# Patient Record
Sex: Male | Born: 1955 | Race: White | Hispanic: No | Smoking: Former smoker
Health system: Southern US, Community
[De-identification: ages and names within clinical notes are randomized; demographics above are authoritative.]

## PROBLEM LIST (undated history)

## (undated) DIAGNOSIS — C679 Malignant neoplasm of bladder, unspecified: Secondary | ICD-10-CM

## (undated) DIAGNOSIS — I10 Essential (primary) hypertension: Secondary | ICD-10-CM

## (undated) DIAGNOSIS — I4891 Unspecified atrial fibrillation: Secondary | ICD-10-CM

## (undated) DIAGNOSIS — I499 Cardiac arrhythmia, unspecified: Secondary | ICD-10-CM

## (undated) DIAGNOSIS — F419 Anxiety disorder, unspecified: Secondary | ICD-10-CM

## (undated) DIAGNOSIS — C61 Malignant neoplasm of prostate: Secondary | ICD-10-CM

## (undated) DIAGNOSIS — M1990 Unspecified osteoarthritis, unspecified site: Secondary | ICD-10-CM

## (undated) DIAGNOSIS — Z973 Presence of spectacles and contact lenses: Secondary | ICD-10-CM

## (undated) DIAGNOSIS — K219 Gastro-esophageal reflux disease without esophagitis: Secondary | ICD-10-CM

## (undated) DIAGNOSIS — I480 Paroxysmal atrial fibrillation: Secondary | ICD-10-CM

## (undated) HISTORY — DX: Unspecified atrial fibrillation: I48.91

## (undated) HISTORY — PX: UPPER GI ENDOSCOPY: SHX6162

## (undated) HISTORY — PX: COLONOSCOPY: SHX174

## (undated) HISTORY — PX: PROSTATE BIOPSY: SHX241

## (undated) HISTORY — PX: TONSILLECTOMY: SUR1361

## (undated) HISTORY — DX: Malignant neoplasm of bladder, unspecified: C67.9

## (undated) HISTORY — DX: Unspecified osteoarthritis, unspecified site: M19.90

## (undated) HISTORY — PX: BLADDER SURGERY: SHX569

---

## 2001-06-02 ENCOUNTER — Emergency Department (HOSPITAL_COMMUNITY): Admission: EM | Admit: 2001-06-02 | Discharge: 2001-06-02 | Payer: Self-pay | Admitting: Emergency Medicine

## 2001-06-04 ENCOUNTER — Other Ambulatory Visit: Admission: RE | Admit: 2001-06-04 | Discharge: 2001-06-04 | Payer: Self-pay | Admitting: General Surgery

## 2005-04-29 HISTORY — PX: PLANTAR FASCIA SURGERY: SHX746

## 2006-03-31 ENCOUNTER — Ambulatory Visit (HOSPITAL_COMMUNITY): Admission: RE | Admit: 2006-03-31 | Discharge: 2006-03-31 | Payer: Self-pay | Admitting: Podiatry

## 2006-03-31 ENCOUNTER — Encounter (INDEPENDENT_AMBULATORY_CARE_PROVIDER_SITE_OTHER): Payer: Self-pay | Admitting: *Deleted

## 2009-02-10 ENCOUNTER — Ambulatory Visit: Payer: Self-pay | Admitting: Gastroenterology

## 2009-02-10 DIAGNOSIS — K219 Gastro-esophageal reflux disease without esophagitis: Secondary | ICD-10-CM | POA: Insufficient documentation

## 2009-02-10 DIAGNOSIS — R1314 Dysphagia, pharyngoesophageal phase: Secondary | ICD-10-CM | POA: Insufficient documentation

## 2009-02-13 ENCOUNTER — Encounter: Payer: Self-pay | Admitting: Gastroenterology

## 2009-02-16 ENCOUNTER — Ambulatory Visit (HOSPITAL_COMMUNITY): Admission: RE | Admit: 2009-02-16 | Discharge: 2009-02-16 | Payer: Self-pay | Admitting: Gastroenterology

## 2009-02-16 ENCOUNTER — Encounter: Payer: Self-pay | Admitting: Gastroenterology

## 2009-02-16 ENCOUNTER — Ambulatory Visit: Payer: Self-pay | Admitting: Gastroenterology

## 2009-02-16 ENCOUNTER — Telehealth: Payer: Self-pay | Admitting: Urgent Care

## 2009-02-17 ENCOUNTER — Encounter: Payer: Self-pay | Admitting: Gastroenterology

## 2009-02-21 ENCOUNTER — Telehealth: Payer: Self-pay | Admitting: Gastroenterology

## 2009-02-24 ENCOUNTER — Ambulatory Visit: Payer: Self-pay | Admitting: Gastroenterology

## 2009-02-24 ENCOUNTER — Ambulatory Visit (HOSPITAL_COMMUNITY): Admission: RE | Admit: 2009-02-24 | Discharge: 2009-02-24 | Payer: Self-pay | Admitting: Gastroenterology

## 2009-03-01 ENCOUNTER — Telehealth (INDEPENDENT_AMBULATORY_CARE_PROVIDER_SITE_OTHER): Payer: Self-pay

## 2009-03-02 ENCOUNTER — Ambulatory Visit (HOSPITAL_COMMUNITY): Admission: RE | Admit: 2009-03-02 | Discharge: 2009-03-02 | Payer: Self-pay | Admitting: Gastroenterology

## 2009-03-02 ENCOUNTER — Encounter: Payer: Self-pay | Admitting: Gastroenterology

## 2009-03-07 ENCOUNTER — Encounter: Payer: Self-pay | Admitting: Gastroenterology

## 2009-04-06 ENCOUNTER — Ambulatory Visit (HOSPITAL_COMMUNITY): Admission: RE | Admit: 2009-04-06 | Discharge: 2009-04-06 | Payer: Self-pay | Admitting: Family Medicine

## 2009-04-07 ENCOUNTER — Ambulatory Visit: Payer: Self-pay | Admitting: Gastroenterology

## 2009-04-07 DIAGNOSIS — Z860100 Personal history of colon polyps, unspecified: Secondary | ICD-10-CM | POA: Insufficient documentation

## 2009-04-07 DIAGNOSIS — A048 Other specified bacterial intestinal infections: Secondary | ICD-10-CM | POA: Insufficient documentation

## 2009-08-17 ENCOUNTER — Encounter: Payer: Self-pay | Admitting: Gastroenterology

## 2009-12-06 ENCOUNTER — Telehealth (INDEPENDENT_AMBULATORY_CARE_PROVIDER_SITE_OTHER): Payer: Self-pay | Admitting: *Deleted

## 2010-03-15 ENCOUNTER — Encounter (INDEPENDENT_AMBULATORY_CARE_PROVIDER_SITE_OTHER): Payer: Self-pay | Admitting: *Deleted

## 2010-04-02 ENCOUNTER — Ambulatory Visit: Payer: Self-pay | Admitting: Gastroenterology

## 2010-05-29 NOTE — Progress Notes (Signed)
  Phone Note Call from Patient   Reason for Call: Refill Medication Summary of Call: Pt needs a Rx fr Omeprazole called in to [facility])  Initial call taken by: Peggyann Shoals,  December 06, 2009 12:03 PM     Appended Document: omeprazole    Prescriptions: OMEPRAZOLE 20 MG CPDR (OMEPRAZOLE) one by mouth 30 min before breakfast daily for acid reflux  #30 x 11   Entered and Authorized by:   Leanna Battles. Dixon Boos   Signed by:   Leanna Battles Lewis PA-C on 12/06/2009   Method used:   Electronically to        [facility] (retail)       [address]       [place], [place]  [address]       Ph: [phone]       Fax: [phone]   RxID:   [id number]

## 2010-05-29 NOTE — Letter (Signed)
Summary: BC[employer] form  BCBS form   Imported By: Hendricks Limes LPN 91/47/8295 62:13:08  _____________________________________________________________________  External Attachment:    Type:   Image     Comment:   External Document

## 2010-05-29 NOTE — Letter (Signed)
Summary: Recall Office Visit  [facility]  [address]   [place], [place] [address]   Phone: [phone]  Fax: [phone]      March 15, 2010   DIEM PAGNOTTA [address] [place], [place]  [address] [birth year]   Dear Mr. Gorgas,   According to our records, it is time for you to schedule a follow-up office visit with [place].   At your convenience, please call [phone] to schedule an office visit. If you have any questions, concerns, or feel that this letter is in error, we would appreciate your call.   Sincerely,    Diana Eves  [facility] Ph: [phone]   Fax: [phone]  Appended Document: fu ov 37yr,gerd/jbb 1 YR F/U OPV IS IN THE COMPUTER

## 2010-05-31 NOTE — Assessment & Plan Note (Signed)
Summary: fu ov 25yr,gerd/jbb   Visit Type:  Follow-up Visit Primary Care Provider:  [facility]  Chief Complaint:  F/U gerd.  History of Present Illness: Mr. Douglas Sims is here for f/u. He has h/o GERD and peptic stricture. He is doing very well on omeprazole. No issues with dysphagia. No abd pain, constipation, diarrhea, melena, brbpr. No n/v. Rarely has breakthrough heartburn. He uses [facility] as needed hand pain. He is awaiting referral to hand specialist for tendon problems. He reports physical exam labwork done recently, unremarkable.  Current Medications (verified): 1)  Alprazolam 1 Mg Tabs (Alprazolam) .... As Needed At Bedtime 2)  Omeprazole 20 Mg Cpdr (Omeprazole) .... One By Mouth 30 Min Before Breakfast Daily For Acid Reflux 3)  [facility] .... As Needed  Allergies (verified): No Known Drug Allergies  Review of Systems      See HPI  Vital Signs:  Patient profile:   55 year old male Height:      72 inches Weight:      200.50 pounds BMI:     27.29 Temp:     97.8 degrees F oral Pulse rate:   72 / minute BP sitting:   110 / 70  (left arm) Cuff size:   regular  Vitals Entered By: Cloria Spring LPN (April 02, 2010 10:45 AM)  Physical Exam  General:  Well developed, well nourished, no acute distress. Head:  Normocephalic and atraumatic. Eyes:  sclera nonicteric Mouth:  op moist Abdomen:  Bowel sounds normal.  Abdomen is soft, nontender, nondistended.  No rebound or guarding.  No hepatosplenomegaly, masses or hernias.  No abdominal bruits.  Extremities:  No clubbing, cyanosis, edema or deformities noted. Neurologic:  Alert and  oriented x4;  grossly normal neurologically. Skin:  Intact without significant lesions or rashes. Psych:  Alert and cooperative. Normal mood and affect.  Impression & Recommendations:  Problem # 1:  GERD (ICD-530.81)  Symptoms well-controlled on omeprazole. No evidence of recurrent peptic stricture. OV one year.   Orders: Est. Patient Level II  (409[address])  Appended Document: fu ov 42yr,gerd/jbb 1 YR F/U OPV IS IN THE COMPUTER

## 2010-09-14 NOTE — Op Note (Signed)
NAME:  Douglas Sims, Douglas Sims                ACCOUNT NO.:  [id number]   MEDICAL RECORD NO.:  [id number]          PATIENT TYPE:  AMB   LOCATION:  DAY                           FACILITY:  [facility]   PHYSICIAN:  Oley Balm. Pricilla Holm, D.P.M.DATE OF BIRTH:  07/10/1955   DATE OF PROCEDURE:  03/31/2006  DATE OF DISCHARGE:                               OPERATIVE REPORT   SURGEON:  Oley Balm. Pricilla Holm, D.P.M.   TYPE OF ANESTHESIA:  Monitored anesthesia care with local infiltrate of  2% Xylocaine and 0.05% Marcaine.   PREOP DIAGNOSIS:  Plantar fibroma, left foot.   POSTOPERATIVE DIAGNOSIS:  Plantar fibroma, left foot.   PROCEDURE:  Radical excision of medial band of plantar fascia with  excision of plantar fibroma.   INDICATIONS FOR SURGERY:  Longstanding history of pain unrelieved by  conservative care.   REFERRAL:  Patrica Duel, M.D.   DESCRIPTION OF PROCEDURE:  The patient brought to the operating room,  and placed on the operating table in the supine position.  The patient's  lower left foot and leg was then prepped and draped in the usual aseptic  manner.  Then with an ankle tourniquet placed and well padded to prevent  contusion; it was elevated 250 mmHg.  After exsanguination of the left  foot, the following surgical procedure was then performed under  monitored anesthesia care with local infiltrate of 2% Xylocaine and 0.5%  Marcaine, approximately 10 mL.   RADICAL EXCISION OF MEDIAL BAND OF THE LEFT PLANTAR FASCIA WITH EXCISION  OF PLANTAR FIBROMA:  Attention was directed to the medial plantar aspect of the left foot,  directly beneath the first metatarsal down to the left heel, where a  lazy S incision was made that measured approximately 6-7 cm in length.  The incision was widened and deepened via sharp and blunt dissection  being sure to identify and retract all vital structures.  The white  glistening mass appeared into the wound which appeared to be  approximately 3 cm in length and 2 cm in  length.  The margins of the  band of the plantar fascia were then identified, and incised at a  plantar fibroma as well as the medial band of the left plantar fascia  was removed in toto. The wound was lavaged with a copious amount of  sterile saline.  The subcutaneous tissues were approximated with a  __________ suture of 4-0 Dexon.  Skin was approximated utilizing  horizontal mattress sutures of 4-0 Prolene.   All surgical sites were then infiltrated with approximately 1/8 mL of  dexamethasone phosphate and mild compressive bandages consisting of  Betadine-soaked Adaptic, sterile 4x4s, and sterile Kling were applied.  The patient tolerated the procedure well; and left the operating room in  apparent good condition with vital signs stable to recovery room.      Oley Balm Pricilla Holm, D.P.M.  Electronically Signed     DBT/MEDQ  D:  03/31/2006  T:  03/31/2006  Job:  [id number]   cc:   Patrica Duel, M.D.  Fax: [phone]

## 2010-09-14 NOTE — H&P (Signed)
NAME:  Douglas Sims, Douglas Sims                ACCOUNT NO.:  [id number]   MEDICAL RECORD NO.:  [id number]          PATIENT TYPE:  AMB   LOCATION:  DAY                           FACILITY:  [facility]   PHYSICIAN:  Oley Balm. Pricilla Holm, D.P.M.DATE OF BIRTH:  [birth year]   DATE OF ADMISSION:  03/31/2006  DATE OF DISCHARGE:  LH                              HISTORY & PHYSICAL   HISTORY OF PRESENT ILLNESS:  Douglas Sims is a 55 year old white male who  presented to the office with a chief complaint of knots on the bottom of  his feet.  The patient relates that the knots have been getting  progressively worse with the knot on the left foot worse than the knot  on the right foot.  The patient relates it is getting progressively  worse to the point where he finds it difficult wearing enclosed shoes.  Treatment has consisted of conservative care with wearing alternative  shoes and injection therapy without relief.  The patient relates that it  has grown to the point of about being an egg and he would like to have  it surgically removed.   PAST HOSPITALIZATIONS:  Hospitalized for accident with his fingers 20  years ago.   MEDICATIONS:  Vicodin, Xanax, Celebrex.   ALLERGIES:  No allergies.   TRANSFUSIONS:  No transfusion or hepatitis.   SOCIAL HISTORY:  He quit smoking.   REVIEW OF SYSTEMS:  Uneventful.   PHYSICAL EXAMINATION:  EXTREMITIES:  Lower extremity exam reveals  palpable pedal pulses, both DP and PT, with spontaneous capillary  filling time.  NEUROLOGIC:  Exam essentially within normal limits.  MUSCULOSKELETAL:  Exam reveals plantar subcutaneous mass on the right  and left foot, with the left foot being worse.  The left foot mass is  approximately 3 x 2 cm in length and width and is very painful to  digital pressure; the right one is about 1 cm in diameter.   RADIOLOGIC FINDINGS:  X-rays are negative for bony pathology.   ASSESSMENT:  Probable plantar fibroma of left foot.   PLAN:  I have reviewed  both conservative and surgical management.  The  patient relates that his problem is getting progressively worse and he  would like to have the problem surgically removed.  I reviewed with him  options which would be conservative therapy which would consist of  continued injection therapy, off-loading, etc.  The patient  relates that he would like to have it surgically removed.  I reviewed  with him the procedure and the complications to the procedure such as  infection, bone infection, postoperative pain, swelling, etc; the  patient seems to understand the same and again, surgery has been  scheduled for March 31, 2006.      Oley Balm Pricilla Holm, D.P.M.  Electronically Signed     DBT/MEDQ  D:  03/30/2006  T:  03/31/2006  Job:  [id number]   cc:   Douglas Sims, M.D.  Fax: [phone]

## 2011-02-01 ENCOUNTER — Encounter: Payer: Self-pay | Admitting: Gastroenterology

## 2011-03-11 ENCOUNTER — Other Ambulatory Visit: Payer: Self-pay | Admitting: Gastroenterology

## 2012-10-12 ENCOUNTER — Emergency Department (HOSPITAL_COMMUNITY)
Admission: EM | Admit: 2012-10-12 | Discharge: 2012-10-12 | Disposition: A | Discharge status: Home / Self Care | Payer: BC Managed Care – PPO | Attending: Emergency Medicine | Admitting: Emergency Medicine

## 2012-10-12 ENCOUNTER — Encounter (HOSPITAL_COMMUNITY): Payer: Self-pay

## 2012-10-12 DIAGNOSIS — M5431 Sciatica, right side: Secondary | ICD-10-CM

## 2012-10-12 DIAGNOSIS — Z79899 Other long term (current) drug therapy: Secondary | ICD-10-CM | POA: Insufficient documentation

## 2012-10-12 DIAGNOSIS — M5430 Sciatica, unspecified side: Secondary | ICD-10-CM | POA: Insufficient documentation

## 2012-10-12 MED ORDER — ONDANSETRON 4 MG PO TBDP: 4.0000 mg | ORAL_TABLET | Freq: Three times a day (TID) | ORAL | Status: DC | PRN

## 2012-10-12 MED ORDER — PREDNISONE 50 MG PO TABS
60.0000 mg | ORAL_TABLET | Freq: Once | ORAL | Status: AC
  Administered 2012-10-12: 60 mg via ORAL
  Filled 2012-10-12: qty 1

## 2012-10-12 MED ORDER — OXYCODONE-ACETAMINOPHEN 5-325 MG PO TABS
2.0000 | ORAL_TABLET | Freq: Once | ORAL | Status: AC
  Administered 2012-10-12: 2 via ORAL
  Filled 2012-10-12: qty 2

## 2012-10-12 MED ORDER — ONDANSETRON 4 MG PO TBDP
4.0000 mg | ORAL_TABLET | Freq: Once | ORAL | Status: AC
  Administered 2012-10-12: 4 mg via ORAL
  Filled 2012-10-12: qty 1

## 2012-10-12 MED ORDER — PREDNISONE 10 MG PO TABS: 20.0000 mg | ORAL_TABLET | Freq: Every day | ORAL | Status: DC

## 2012-10-12 MED ORDER — OXYCODONE-ACETAMINOPHEN 5-325 MG PO TABS: 1.0000 | ORAL_TABLET | ORAL | Status: DC | PRN

## 2012-10-12 NOTE — ED Provider Notes (Signed)
History     CSN: [id number]  Arrival date & time 10/12/12  4098   First MD Initiated Contact with Patient 10/12/12 [phone]      Chief Complaint  Patient presents with  . Leg Pain    (Consider location/radiation/quality/duration/timing/severity/associated sxs/prior treatment) HPI HPI Comments: Douglas Sims is a 57 y.o. male who presents to the Emergency Department complaining of right sided pain from the buttock to the heel. He went to the cruise in yesterday in [place] and took a step and felt a twinge in his back. He developed increased pain tonight walking at work on the concrete floors. Now the pain is sharp from the right buttock to the heel. Denies any known injury, fever, chills, nausea, vomiting.   PCP Dr. Regino Schultze History reviewed. No pertinent past medical history.  History reviewed. No pertinent past surgical history.  No family history on file.  History  Substance Use Topics  . Smoking status: Never Smoker   . Smokeless tobacco: Not on file  . Alcohol Use: No      Review of Systems  Constitutional: Negative for fever.       10 Systems reviewed and are negative for acute change except as noted in the HPI.  HENT: Negative for congestion.   Eyes: Negative for discharge and redness.  Respiratory: Negative for cough and shortness of breath.   Cardiovascular: Negative for chest pain.  Gastrointestinal: Negative for vomiting and abdominal pain.  Musculoskeletal: Negative for back pain.       Right buttock to heel pain  Skin: Negative for rash.  Neurological: Negative for syncope, numbness and headaches.  Psychiatric/Behavioral:       No behavior change.    Allergies  Review of patient's allergies indicates no known allergies.  Home Medications   Current Outpatient Rx  Name  Route  Sig  Dispense  Refill  . ALPRAZolam (XANAX) 0.5 MG tablet   Oral   Take 0.5 mg by mouth at bedtime as needed for sleep.         Marland Kitchen omeprazole (PRILOSEC) 20 MG capsule     TAKE ONE CAPSULE BY MOUTH EVERY DAY 30  MINUTES  BEFORE  BREAKFAST  FOR  ACID  REFLUX   31 capsule   11     BP 114/76  Pulse 62  Temp(Src) 98.6 F (37 C) (Oral)  Resp 20  Ht 6' (1.829 m)  Wt 192 lb (87.091 kg)  BMI 26.03 kg/m2  SpO2 98%  Physical Exam  Nursing note and vitals reviewed. Constitutional: He appears well-developed and well-nourished.  Awake, alert, nontoxic appearance.  HENT:  Head: Normocephalic and atraumatic.  Eyes: EOM are normal. Pupils are equal, round, and reactive to light.  Neck: Normal range of motion. Neck supple.  Cardiovascular: Normal rate and intact distal pulses.   Pulmonary/Chest: Effort normal and breath sounds normal. He exhibits no tenderness.  Abdominal: Soft. Bowel sounds are normal. There is no tenderness. There is no rebound.  Musculoskeletal: He exhibits no tenderness.  Baseline ROM, no obvious new focal weakness.Negative straight leg raise. Pulses 2+ on right side. Mild pain with palpation to right buttock.  Neurological:  Mental status and motor strength appears baseline for patient and situation.  Skin: No rash noted.  Psychiatric: He has a normal mood and affect.    ED Course  Procedures (including critical care time)     MDM  Patient with a right sciatica. Given prednisone, percocet, zofran. Pt stable in ED with no significant  deterioration in condition.The patient appears reasonably screened and/or stabilized for discharge and I doubt any other medical condition or other [facility] requiring further screening, evaluation, or treatment in the ED at this time prior to discharge.  MDM Reviewed: nursing note and vitals           Nicoletta Dress. Colon Branch, MD 10/12/12 [phone]

## 2012-10-12 NOTE — ED Notes (Signed)
Sharp pain starts in right buttock and shoots down right leg.  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

## 2013-05-11 ENCOUNTER — Other Ambulatory Visit: Payer: Self-pay | Admitting: *Deleted

## 2013-05-11 MED ORDER — OMEPRAZOLE 20 MG PO CPDR: DELAYED_RELEASE_CAPSULE | ORAL | Status: DC

## 2013-05-11 NOTE — Telephone Encounter (Signed)
Completed.

## 2013-05-11 NOTE — Telephone Encounter (Signed)
Pt's wife called stating pt needs his medication refilled omeprazole 20mg  pt uses [facility]

## 2013-06-08 ENCOUNTER — Telehealth: Payer: Self-pay

## 2013-06-08 MED ORDER — OMEPRAZOLE 20 MG PO CPDR
DELAYED_RELEASE_CAPSULE | ORAL | Status: DC
Start: 2013-06-08 — End: 2016-07-10

## 2013-06-08 NOTE — Telephone Encounter (Signed)
Completed. Back down to once a day when tolerated.

## 2013-06-08 NOTE — Telephone Encounter (Signed)
Pt had his Omeprazole filled in 04/2013 and it has refills. But he is having problems with reflux and taking it bid some and wanted to know if the prescription could be changed to bid. Please advise!

## 2014-05-23 ENCOUNTER — Other Ambulatory Visit: Payer: Self-pay | Admitting: Orthopedic Surgery

## 2014-06-03 ENCOUNTER — Encounter (HOSPITAL_BASED_OUTPATIENT_CLINIC_OR_DEPARTMENT_OTHER): Payer: Self-pay | Admitting: *Deleted

## 2014-06-03 NOTE — Progress Notes (Signed)
No labs needed

## 2014-06-07 ENCOUNTER — Ambulatory Visit (HOSPITAL_BASED_OUTPATIENT_CLINIC_OR_DEPARTMENT_OTHER)
Admission: RE | Admit: 2014-06-07 | Discharge: 2014-06-07 | Disposition: A | Discharge status: Home / Self Care | Payer: BLUE CROSS/BLUE SHIELD | Source: Ambulatory Visit | Attending: Orthopedic Surgery | Admitting: Orthopedic Surgery

## 2014-06-07 ENCOUNTER — Ambulatory Visit (HOSPITAL_BASED_OUTPATIENT_CLINIC_OR_DEPARTMENT_OTHER): Payer: BLUE CROSS/BLUE SHIELD | Admitting: Anesthesiology

## 2014-06-07 ENCOUNTER — Encounter (HOSPITAL_BASED_OUTPATIENT_CLINIC_OR_DEPARTMENT_OTHER): Payer: Self-pay | Admitting: Orthopedic Surgery

## 2014-06-07 ENCOUNTER — Encounter (HOSPITAL_BASED_OUTPATIENT_CLINIC_OR_DEPARTMENT_OTHER)
Admission: RE | Disposition: A | Discharge status: Home / Self Care | Payer: Self-pay | Source: Ambulatory Visit | Attending: Orthopedic Surgery

## 2014-06-07 DIAGNOSIS — Z79899 Other long term (current) drug therapy: Secondary | ICD-10-CM | POA: Diagnosis not present

## 2014-06-07 DIAGNOSIS — M720 Palmar fascial fibromatosis [Dupuytren]: Secondary | ICD-10-CM | POA: Insufficient documentation

## 2014-06-07 DIAGNOSIS — Z87891 Personal history of nicotine dependence: Secondary | ICD-10-CM | POA: Insufficient documentation

## 2014-06-07 DIAGNOSIS — K219 Gastro-esophageal reflux disease without esophagitis: Secondary | ICD-10-CM | POA: Insufficient documentation

## 2014-06-07 HISTORY — DX: Gastro-esophageal reflux disease without esophagitis: K21.9

## 2014-06-07 HISTORY — DX: Presence of spectacles and contact lenses: Z97.3

## 2014-06-07 HISTORY — PX: FASCIECTOMY: SHX6525

## 2014-06-07 LAB — POCT HEMOGLOBIN-HEMACUE: HEMOGLOBIN: 14.9 g/dL (ref 13.0–17.0)

## 2014-06-07 SURGERY — FASCIECTOMY, PALM
Anesthesia: General | Site: Hand | Laterality: Left

## 2014-06-07 MED ORDER — MIDAZOLAM HCL 5 MG/5ML IJ SOLN
INTRAMUSCULAR | Status: DC | PRN
  Administered 2014-06-07: 2 mg via INTRAVENOUS

## 2014-06-07 MED ORDER — CEFAZOLIN SODIUM-DEXTROSE 2-3 GM-% IV SOLR: 2.0000 g | INTRAVENOUS | Status: DC

## 2014-06-07 MED ORDER — BUPIVACAINE HCL (PF) 0.25 % IJ SOLN
INTRAMUSCULAR | Status: AC
  Filled 2014-06-07: qty 30

## 2014-06-07 MED ORDER — THROMBIN 5000 UNITS EX SOLR
CUTANEOUS | Status: DC | PRN
  Administered 2014-06-07: 5000 [IU] via TOPICAL

## 2014-06-07 MED ORDER — HYDROMORPHONE HCL 1 MG/ML IJ SOLN: 0.2500 mg | INTRAMUSCULAR | Status: DC | PRN

## 2014-06-07 MED ORDER — THROMBIN 5000 UNITS EX SOLR
CUTANEOUS | Status: AC
  Filled 2014-06-07: qty 5000

## 2014-06-07 MED ORDER — MIDAZOLAM HCL 2 MG/2ML IJ SOLN: 1.0000 mg | INTRAMUSCULAR | Status: DC | PRN

## 2014-06-07 MED ORDER — CEFAZOLIN SODIUM-DEXTROSE 2-3 GM-% IV SOLR
2.0000 g | INTRAVENOUS | Status: AC
  Administered 2014-06-07: 2 g via INTRAVENOUS

## 2014-06-07 MED ORDER — CHLORHEXIDINE GLUCONATE 4 % EX LIQD: 60.0000 mL | Freq: Once | CUTANEOUS | Status: DC

## 2014-06-07 MED ORDER — DEXAMETHASONE SODIUM PHOSPHATE 4 MG/ML IJ SOLN
INTRAMUSCULAR | Status: DC | PRN
  Administered 2014-06-07: 10 mg via INTRAVENOUS

## 2014-06-07 MED ORDER — CEFAZOLIN SODIUM-DEXTROSE 2-3 GM-% IV SOLR
INTRAVENOUS | Status: AC
  Filled 2014-06-07: qty 50

## 2014-06-07 MED ORDER — OXYCODONE HCL 5 MG/5ML PO SOLN: 5.0000 mg | Freq: Once | ORAL | Status: AC | PRN

## 2014-06-07 MED ORDER — FENTANYL CITRATE 0.05 MG/ML IJ SOLN: 50.0000 ug | INTRAMUSCULAR | Status: DC | PRN

## 2014-06-07 MED ORDER — OXYCODONE HCL 5 MG PO TABS
5.0000 mg | ORAL_TABLET | Freq: Once | ORAL | Status: AC | PRN
  Administered 2014-06-07: 5 mg via ORAL

## 2014-06-07 MED ORDER — LIDOCAINE HCL (CARDIAC) 20 MG/ML IV SOLN
INTRAVENOUS | Status: DC | PRN
  Administered 2014-06-07: 100 mg via INTRAVENOUS

## 2014-06-07 MED ORDER — ONDANSETRON HCL 4 MG/2ML IJ SOLN: 4.0000 mg | Freq: Once | INTRAMUSCULAR | Status: DC | PRN

## 2014-06-07 MED ORDER — FENTANYL CITRATE 0.05 MG/ML IJ SOLN
INTRAMUSCULAR | Status: AC
  Filled 2014-06-07: qty 4

## 2014-06-07 MED ORDER — ONDANSETRON HCL 4 MG/2ML IJ SOLN
INTRAMUSCULAR | Status: DC | PRN
  Administered 2014-06-07: 4 mg via INTRAVENOUS

## 2014-06-07 MED ORDER — PROPOFOL 10 MG/ML IV EMUL
INTRAVENOUS | Status: AC
  Filled 2014-06-07: qty 50

## 2014-06-07 MED ORDER — LACTATED RINGERS IV SOLN
INTRAVENOUS | Status: DC
  Administered 2014-06-07 (×2): via INTRAVENOUS

## 2014-06-07 MED ORDER — MIDAZOLAM HCL 2 MG/2ML IJ SOLN
INTRAMUSCULAR | Status: AC
  Filled 2014-06-07: qty 2

## 2014-06-07 MED ORDER — HYDROCODONE-ACETAMINOPHEN 5-325 MG PO TABS: 1.0000 | ORAL_TABLET | Freq: Four times a day (QID) | ORAL | Status: DC | PRN

## 2014-06-07 MED ORDER — FENTANYL CITRATE 0.05 MG/ML IJ SOLN
INTRAMUSCULAR | Status: DC | PRN
  Administered 2014-06-07: 100 ug via INTRAVENOUS
  Administered 2014-06-07: 25 ug via INTRAVENOUS

## 2014-06-07 MED ORDER — PROPOFOL 10 MG/ML IV BOLUS
INTRAVENOUS | Status: DC | PRN
  Administered 2014-06-07: 200 mg via INTRAVENOUS

## 2014-06-07 MED ORDER — OXYCODONE HCL 5 MG PO TABS
ORAL_TABLET | ORAL | Status: AC
  Filled 2014-06-07: qty 1

## 2014-06-07 MED ORDER — BUPIVACAINE HCL (PF) 0.25 % IJ SOLN
INTRAMUSCULAR | Status: DC | PRN
  Administered 2014-06-07: 8 mL

## 2014-06-07 SURGICAL SUPPLY — 53 items
BLADE MINI RND TIP GREEN BEAV (BLADE) ×1 IMPLANT
BLADE SURG 15 STRL LF DISP TIS (BLADE) ×1 IMPLANT
BLADE SURG 15 STRL SS (BLADE) ×2
BNDG CMPR 9X4 STRL LF SNTH (GAUZE/BANDAGES/DRESSINGS) ×1
BNDG COHESIVE 3X5 TAN STRL LF (GAUZE/BANDAGES/DRESSINGS) ×2 IMPLANT
BNDG ESMARK 4X9 LF (GAUZE/BANDAGES/DRESSINGS) ×2 IMPLANT
BNDG GAUZE ELAST 4 BULKY (GAUZE/BANDAGES/DRESSINGS) ×2 IMPLANT
CHLORAPREP W/TINT 26ML (MISCELLANEOUS) ×2 IMPLANT
CORDS BIPOLAR (ELECTRODE) ×2 IMPLANT
COVER BACK TABLE 60X90IN (DRAPES) ×2 IMPLANT
COVER MAYO STAND STRL (DRAPES) ×2 IMPLANT
CUFF TOURNIQUET SINGLE 18IN (TOURNIQUET CUFF) ×1 IMPLANT
DECANTER SPIKE VIAL GLASS SM (MISCELLANEOUS) IMPLANT
DRAPE EXTREMITY T 121X128X90 (DRAPE) ×2 IMPLANT
DRAPE SURG 17X23 STRL (DRAPES) ×2 IMPLANT
DRSG KUZMA FLUFF (GAUZE/BANDAGES/DRESSINGS) ×2 IMPLANT
GAUZE SPONGE 4X4 12PLY STRL (GAUZE/BANDAGES/DRESSINGS) ×2 IMPLANT
GAUZE XEROFORM 1X8 LF (GAUZE/BANDAGES/DRESSINGS) ×2 IMPLANT
GLOVE BIO SURGEON STRL SZ7.5 (GLOVE) ×1 IMPLANT
GLOVE BIOGEL PI IND STRL 7.0 (GLOVE) IMPLANT
GLOVE BIOGEL PI IND STRL 7.5 (GLOVE) IMPLANT
GLOVE BIOGEL PI IND STRL 8 (GLOVE) IMPLANT
GLOVE BIOGEL PI IND STRL 8.5 (GLOVE) ×1 IMPLANT
GLOVE BIOGEL PI INDICATOR 7.0 (GLOVE) ×1
GLOVE BIOGEL PI INDICATOR 7.5 (GLOVE) ×1
GLOVE BIOGEL PI INDICATOR 8 (GLOVE) ×1
GLOVE BIOGEL PI INDICATOR 8.5 (GLOVE) ×1
GLOVE ECLIPSE 6.5 STRL STRAW (GLOVE) ×1 IMPLANT
GLOVE EXAM NITRILE LRG STRL (GLOVE) ×1 IMPLANT
GLOVE SURG ORTHO 8.0 STRL STRW (GLOVE) ×2 IMPLANT
GLOVE SURG SS PI 7.0 STRL IVOR (GLOVE) ×1 IMPLANT
GOWN STRL REUS W/ TWL LRG LVL3 (GOWN DISPOSABLE) ×1 IMPLANT
GOWN STRL REUS W/TWL LRG LVL3 (GOWN DISPOSABLE) ×4
GOWN STRL REUS W/TWL XL LVL3 (GOWN DISPOSABLE) ×3 IMPLANT
LOOP VESSEL MAXI BLUE (MISCELLANEOUS) ×2 IMPLANT
NS IRRIG 1000ML POUR BTL (IV SOLUTION) ×2 IMPLANT
PACK BASIN DAY SURGERY FS (CUSTOM PROCEDURE TRAY) ×2 IMPLANT
PAD CAST 3X4 CTTN HI CHSV (CAST SUPPLIES) ×1 IMPLANT
PADDING CAST ABS 3INX4YD NS (CAST SUPPLIES)
PADDING CAST ABS 4INX4YD NS (CAST SUPPLIES) ×1
PADDING CAST ABS COTTON 3X4 (CAST SUPPLIES) IMPLANT
PADDING CAST ABS COTTON 4X4 ST (CAST SUPPLIES) ×1 IMPLANT
PADDING CAST COTTON 3X4 STRL (CAST SUPPLIES) ×2
SLEEVE SCD COMPRESS KNEE MED (MISCELLANEOUS) ×2 IMPLANT
SPLINT PLASTER CAST XFAST 3X15 (CAST SUPPLIES) IMPLANT
SPLINT PLASTER XTRA FASTSET 3X (CAST SUPPLIES) ×10
STOCKINETTE 4X48 STRL (DRAPES) ×2 IMPLANT
SUT ETHILON 5 0 PS 2 18 (SUTURE) ×1 IMPLANT
SUT SILK 2 0 FS (SUTURE) ×1 IMPLANT
SYR BULB 3OZ (MISCELLANEOUS) ×2 IMPLANT
SYR CONTROL 10ML LL (SYRINGE) ×2 IMPLANT
TOWEL OR 17X24 6PK STRL BLUE (TOWEL DISPOSABLE) ×4 IMPLANT
UNDERPAD 30X30 INCONTINENT (UNDERPADS AND DIAPERS) ×1 IMPLANT

## 2014-06-07 NOTE — Anesthesia Preprocedure Evaluation (Signed)
Anesthesia Evaluation  Patient identified by MRN, date of birth, ID band Patient awake    Reviewed: Allergy & Precautions, NPO status , Patient's Chart, lab work & pertinent test results  Airway Mallampati: I  TM Distance: >3 FB Neck ROM: Full    Dental  (+) Teeth Intact, Dental Advisory Given   Pulmonary former smoker,  breath sounds clear to auscultation        Cardiovascular Rhythm:Regular Rate:Normal     Neuro/Psych    GI/Hepatic GERD-  Medicated and Controlled,  Endo/Other    Renal/GU      Musculoskeletal   Abdominal   Peds  Hematology   Anesthesia Other Findings   Reproductive/Obstetrics                            Anesthesia Physical Anesthesia Plan  ASA: I  Anesthesia Plan: General   Post-op Pain Management:    Induction: Intravenous  Airway Management Planned: LMA  Additional Equipment:   Intra-op Plan:   Post-operative Plan: Extubation in OR  Informed Consent: I have reviewed the patients History and Physical, chart, labs and discussed the procedure including the risks, benefits and alternatives for the proposed anesthesia with the patient or authorized representative who has indicated his/her understanding and acceptance.   Dental advisory given  Plan Discussed with: CRNA, Anesthesiologist and Surgeon  Anesthesia Plan Comments:         Anesthesia Quick Evaluation  

## 2014-06-07 NOTE — Anesthesia Procedure Notes (Signed)
Procedure Name: Intubation Date/Time: 06/07/2014 12:12 PM Performed by: Lyndee Leo Pre-anesthesia Checklist: Patient identified, Emergency Drugs available, Suction available and Patient being monitored Patient Re-evaluated:Patient Re-evaluated prior to inductionOxygen Delivery Method: Circle System Utilized Preoxygenation: Pre-oxygenation with 100% oxygen Intubation Type: IV induction Ventilation: Mask ventilation without difficulty LMA: LMA inserted LMA Size: 4.0 Number of attempts: 1 Airway Equipment and Method: Oral airway Tube secured with: Tape Dental Injury: Teeth and Oropharynx as per pre-operative assessment

## 2014-06-07 NOTE — Discharge Instructions (Addendum)

## 2014-06-07 NOTE — Op Note (Signed)
Dictation Number [id number]

## 2014-06-07 NOTE — H&P (Signed)
Douglas Sims is a 59 year-old right-hand dominant male referred by Dr. Patrica Duel for consultation with respect to Dupuytren's contracture of his left ring finger. This has been present for approximately 1-2 years, gradually increasing.  He is right-hand dominant.  He is a Dealer.  He has no history of diabetes, thyroid problems, arthritis or gout. He is of native [place] descent.  He did have these on his feet, he did not have on his penis.  He has a prior history of an injury to his index and middle finger left hand in a carding machine treated by Dr. Alphonzo Cruise when he first started practice.  He complains of a feeling of stabbing pain to the left hand when he grips something due to the mass in the palm. He has had Dupuytren's excised by Dr. Alphonzo Cruise on his right side, ring finger.   ALLERGIES:     None. MEDICATIONS:    Omeprazole, Xanax, meloxicam.  SURGICAL HISTORY:    Both feet and right hand operated on for the Dupuytren's and Ledderhose.   FAMILY MEDICAL HISTORY:  Negative. SOCIAL HISTORY:     He does not smoke or drink.  He is married and works as a Tax adviser at [employer]. REVIEW OF SYSTEMS:    Positive for glasses, otherwise negative 14 points.   Douglas Sims is an 59 y.o. male.   Chief Complaint: Dupuytren's contracture left ring finger HPI: see above  Past Medical History  Diagnosis Date  . Wears glasses   . GERD (gastroesophageal reflux disease)     Past Surgical History  Procedure Laterality Date  . Colonoscopy    . Upper gi endoscopy    . Plantar fascia surgery  2007    left foot  . Tonsillectomy      History reviewed. No pertinent family history. Social History:  reports that he quit smoking about 21 years ago. He does not have any smokeless tobacco history on file. He reports that he does not drink alcohol or use illicit drugs.  Allergies: No Known Allergies  No prescriptions prior to admission    No results found for this or any previous visit (from  the past 48 hour(s)).  No results found.   Pertinent items are noted in HPI.  Height 6' (1.829 m), weight 86.183 kg (190 lb).  General appearance: alert, cooperative and appears stated age Head: Normocephalic, without obvious abnormality Neck: no JVD Resp: clear to auscultation bilaterally Cardio: regular rate and rhythm, S1, S2 normal, no murmur, click, rub or gallop GI: soft, non-tender; bowel sounds normal; no masses,  no organomegaly Extremities: flexion contracture left ring finger Pulses: 2+ and symmetric Skin: Skin color, texture, turgor normal. No rashes or lesions or normal and vascularity normal Neurologic: Grossly normal Incision/Wound: na  Assessment/Plan X-rays reveal degenerative change at the carpometacarpal joint of his thumb, pan trapezial in nature. This is asymptomatic.    DIAGNOSIS:   Dupuytren's contracture left ring finger.    RECOMMENDATIONS/PLAN:    He would like to have this operated on.  We have discussed various treatment alternatives with him along with the etiology.  We have discussed the possibility of aponeurotomies, flexor injection, fasciotomy, fasciectomy, the risks and complications of each are discussed.  He is advised there is potential for infection with any treatment, the possibility of dystrophy, possibility of injury to arteries, nerves, tendons, possibility of loss of finger, possibility of flexion deformities recurring, the recurrence rate of each are discussed at length with him.  Approximately 35 minutes was spent with the patient .  He would like to proceed and would like to be scheduled for fasciectomy ring finger, left hand.  This is being scheduled as an outpatient under regional anesthesia.  Secret Kristensen R 06/07/2014, 9:21 AM

## 2014-06-07 NOTE — Anesthesia Postprocedure Evaluation (Signed)
  Anesthesia Post-op Note  Patient: Douglas Sims  Procedure(s) Performed: Procedure(s): LEFT PALM FASCIECTOMY LEFT RING FINGER (Left)  Patient Location: PACU  Anesthesia Type: General   Level of Consciousness: awake, alert  and oriented  Airway and Oxygen Therapy: Patient Spontanous Breathing  Post-op Pain: none  Post-op Assessment: Post-op Vital signs reviewed  Post-op Vital Signs: Reviewed  Last Vitals:  Filed Vitals:   06/07/14 1345  BP: 137/89  Pulse: 60  Temp: 36.4 C  Resp: 18    Complications: No apparent anesthesia complications

## 2014-06-07 NOTE — Brief Op Note (Signed)
06/07/2014  1:01 PM  PATIENT:  Douglas Sims  59 y.o. male  PRE-OPERATIVE DIAGNOSIS:  DUPUYTREN'S CONTRACTURE LEFT RING FINGER  POST-OPERATIVE DIAGNOSIS:  DUPUYTREN'S CONTRACTURE LEFT RING FINGER  PROCEDURE:  Procedure(s): LEFT PALM FASCIECTOMY LEFT RING FINGER (Left)  SURGEON:  Surgeon(s) and Role:    * Daryll Brod, MD - Primary    * Leanora Cover, MD - Assisting  PHYSICIAN ASSISTANT:   ASSISTANTS: K Zyeir Dymek,MD   ANESTHESIA:   local and general  EBL:  Total I/O In: 1000 [I.V.:1000] Out: -   BLOOD ADMINISTERED:none  DRAINS: vessel loop   LOCAL MEDICATIONS USED:  BUPIVICAINE   SPECIMEN:  Excision  DISPOSITION OF SPECIMEN:  PATHOLOGY  COUNTS:  YES  TOURNIQUET:   Total Tourniquet Time Documented: Upper Arm (Left) - 32 minutes Total: Upper Arm (Left) - 32 minutes   DICTATION: .Other Dictation: Dictation Number [id number]  PLAN OF CARE: Discharge to home after PACU  PATIENT DISPOSITION:  PACU - hemodynamically stable.

## 2014-06-07 NOTE — Transfer of Care (Signed)
Immediate Anesthesia Transfer of Care Note  Patient: Douglas Sims  Procedure(s) Performed: Procedure(s): LEFT PALM FASCIECTOMY LEFT RING FINGER (Left)  Patient Location: PACU  Anesthesia Type:General  Level of Consciousness: sedated and unresponsive  Airway & Oxygen Therapy: Patient Spontanous Breathing and Patient connected to face mask oxygen  Post-op Assessment: Report given to RN and Post -op Vital signs reviewed and stable  Post vital signs: Reviewed and stable  Last Vitals:  Filed Vitals:   06/07/14 1007  BP: 140/77  Pulse: 55  Temp: 36.5 C  Resp: 20    Complications: No apparent anesthesia complications

## 2014-06-08 ENCOUNTER — Encounter (HOSPITAL_BASED_OUTPATIENT_CLINIC_OR_DEPARTMENT_OTHER): Payer: Self-pay | Admitting: Orthopedic Surgery

## 2014-06-08 NOTE — Op Note (Signed)
NAME:  Douglas Sims, Douglas Sims NO.:  [id number]  MEDICAL RECORD NO.:  [id number]  LOCATION:                                 FACILITY:  PHYSICIAN:  Daryll Brod, M.D.            DATE OF BIRTH:  DATE OF PROCEDURE:  06/07/2014 DATE OF DISCHARGE:                              OPERATIVE REPORT   PREOPERATIVE DIAGNOSIS:  Dupuytren contracture, left ring finger.  POSTOPERATIVE DIAGNOSIS:  Dupuytren contracture, left ring finger.  OPERATION:  Fasciectomy of palmar fascia, left ring finger.  SURGEON:  Daryll Brod, MD  ASSISTANT:  Leanora Cover, MD  ANESTHESIA:  General with local infiltration.  ANESTHESIOLOGIST:  Crews.  HISTORY:  The patient is a 59 year old male with a history of Dupuytren contracture.  He has undergone excision on his right side, has developed a contracture to the left ring finger.  He is desirous proceeding to have this excised.  He is aware of other treatment alternatives including epineurotomy, Xiaflex injection, fasciotomy, elected to undergo fasciotomy.  Pre, peri, and postoperative course have been discussed along with risks and complications.  He is aware that there is no guarantee with the surgery, possibility of infection; recurrence of injury to arteries, nerves, tendons; incomplete relief of symptoms, dystrophy, possibility of open areas, loss of skin.  In the preoperative area, the patient is seen, the extremity marked by both patient and surgeon.  Antibiotic given.  PROCEDURE IN DETAIL:  The patient was brought to the operating room, where general anesthetic was carried out without difficulty.  He was prepped using ChloraPrep, supine position with the left arm free.  A 3- minute dry time was allowed.  Time-out taken, confirming the patient and procedure.  A volar Bruner incision was made, carried down through subcutaneous tissue.  This was directly over left ring finger cord. Neurovascular structures were identified proximally.  The fascia  was then followed distally.  This was a central cord.  This was released up to the A2 pulley.  The dissection carried down onto the proximal phalanx to middle phalanx.  Lateral digital sheet was involved.  Neurovascular bundles were dissected free with moderate difficulty.  The cord entered into the skin and several places requiring opening of the skin.  Small skin tear was occurred at the distal metacarpophalangeal joint crease. The entire cord was removed, sent to Pathology.  The finger came entirely straight.  The neurovascular bundles were intact over the entire course.  The wound was copiously irrigated with saline.  This was then sprayed with thrombin spray.  A doubled over vessel loop drain was then placed to the depths of the wound.  The incisions were then closed with interrupted 5-0 nylon sutures.  The local infiltration with 0.25% bupivacaine without epinephrine, approximately 9 mL was given.  This area was done over the entire palm.  A sterile compressive dressing was applied.  The tourniquet was deflated.  The fingers immediately pinked. A splint was applied dorsally. The dressing completed and the patient taken to the recovery room for observation in satisfactory condition.  Again, specimen was sent to Pathology.  The patient will be discharged home to return  to the [facility] in 1 week on Norco.          ______________________________ Daryll Brod, M.D.     GK/MEDQ  D:  06/07/2014  T:  06/08/2014  Job:  [id number]

## 2015-06-13 DIAGNOSIS — N486 Induration penis plastica: Secondary | ICD-10-CM | POA: Insufficient documentation

## 2015-09-29 DIAGNOSIS — R3912 Poor urinary stream: Secondary | ICD-10-CM | POA: Diagnosis not present

## 2015-09-29 DIAGNOSIS — D494 Neoplasm of unspecified behavior of bladder: Secondary | ICD-10-CM | POA: Diagnosis not present

## 2015-09-29 DIAGNOSIS — N486 Induration penis plastica: Secondary | ICD-10-CM | POA: Diagnosis not present

## 2015-10-02 DIAGNOSIS — Z7982 Long term (current) use of aspirin: Secondary | ICD-10-CM | POA: Diagnosis not present

## 2015-10-02 DIAGNOSIS — N329 Bladder disorder, unspecified: Secondary | ICD-10-CM | POA: Diagnosis not present

## 2015-10-02 DIAGNOSIS — D090 Carcinoma in situ of bladder: Secondary | ICD-10-CM | POA: Diagnosis not present

## 2015-10-02 DIAGNOSIS — Z87891 Personal history of nicotine dependence: Secondary | ICD-10-CM | POA: Diagnosis not present

## 2015-10-02 DIAGNOSIS — R3912 Poor urinary stream: Secondary | ICD-10-CM | POA: Diagnosis not present

## 2015-10-02 DIAGNOSIS — C674 Malignant neoplasm of posterior wall of bladder: Secondary | ICD-10-CM | POA: Diagnosis not present

## 2015-10-02 DIAGNOSIS — N4289 Other specified disorders of prostate: Secondary | ICD-10-CM | POA: Diagnosis not present

## 2015-10-02 DIAGNOSIS — Z79899 Other long term (current) drug therapy: Secondary | ICD-10-CM | POA: Diagnosis not present

## 2015-10-02 DIAGNOSIS — N486 Induration penis plastica: Secondary | ICD-10-CM | POA: Diagnosis not present

## 2015-10-02 DIAGNOSIS — D494 Neoplasm of unspecified behavior of bladder: Secondary | ICD-10-CM | POA: Diagnosis not present

## 2015-10-02 DIAGNOSIS — K219 Gastro-esophageal reflux disease without esophagitis: Secondary | ICD-10-CM | POA: Diagnosis not present

## 2015-10-02 DIAGNOSIS — C679 Malignant neoplasm of bladder, unspecified: Secondary | ICD-10-CM | POA: Diagnosis not present

## 2015-10-02 DIAGNOSIS — F419 Anxiety disorder, unspecified: Secondary | ICD-10-CM | POA: Diagnosis not present

## 2015-10-13 DIAGNOSIS — D090 Carcinoma in situ of bladder: Secondary | ICD-10-CM | POA: Diagnosis not present

## 2015-10-20 DIAGNOSIS — D090 Carcinoma in situ of bladder: Secondary | ICD-10-CM | POA: Diagnosis not present

## 2015-10-30 DIAGNOSIS — D090 Carcinoma in situ of bladder: Secondary | ICD-10-CM | POA: Diagnosis not present

## 2015-11-06 DIAGNOSIS — C679 Malignant neoplasm of bladder, unspecified: Secondary | ICD-10-CM | POA: Diagnosis not present

## 2015-11-06 DIAGNOSIS — D090 Carcinoma in situ of bladder: Secondary | ICD-10-CM | POA: Diagnosis not present

## 2015-11-17 DIAGNOSIS — D090 Carcinoma in situ of bladder: Secondary | ICD-10-CM | POA: Diagnosis not present

## 2015-11-17 DIAGNOSIS — C679 Malignant neoplasm of bladder, unspecified: Secondary | ICD-10-CM | POA: Diagnosis not present

## 2015-11-24 DIAGNOSIS — D090 Carcinoma in situ of bladder: Secondary | ICD-10-CM | POA: Diagnosis not present

## 2015-11-24 DIAGNOSIS — C679 Malignant neoplasm of bladder, unspecified: Secondary | ICD-10-CM | POA: Diagnosis not present

## 2015-12-01 DIAGNOSIS — D090 Carcinoma in situ of bladder: Secondary | ICD-10-CM | POA: Diagnosis not present

## 2015-12-29 DIAGNOSIS — C672 Malignant neoplasm of lateral wall of bladder: Secondary | ICD-10-CM | POA: Diagnosis not present

## 2015-12-29 DIAGNOSIS — R1900 Intra-abdominal and pelvic swelling, mass and lump, unspecified site: Secondary | ICD-10-CM | POA: Diagnosis not present

## 2015-12-29 DIAGNOSIS — R300 Dysuria: Secondary | ICD-10-CM | POA: Diagnosis not present

## 2016-01-02 DIAGNOSIS — R911 Solitary pulmonary nodule: Secondary | ICD-10-CM | POA: Diagnosis not present

## 2016-01-02 DIAGNOSIS — K828 Other specified diseases of gallbladder: Secondary | ICD-10-CM | POA: Diagnosis not present

## 2016-01-02 DIAGNOSIS — K769 Liver disease, unspecified: Secondary | ICD-10-CM | POA: Diagnosis not present

## 2016-01-02 DIAGNOSIS — R1900 Intra-abdominal and pelvic swelling, mass and lump, unspecified site: Secondary | ICD-10-CM | POA: Diagnosis not present

## 2016-01-02 DIAGNOSIS — C672 Malignant neoplasm of lateral wall of bladder: Secondary | ICD-10-CM | POA: Diagnosis not present

## 2016-01-03 ENCOUNTER — Ambulatory Visit (INDEPENDENT_AMBULATORY_CARE_PROVIDER_SITE_OTHER): Payer: BLUE CROSS/BLUE SHIELD | Admitting: Gastroenterology

## 2016-01-03 ENCOUNTER — Encounter: Payer: Self-pay | Admitting: Gastroenterology

## 2016-01-03 ENCOUNTER — Other Ambulatory Visit: Payer: Self-pay

## 2016-01-03 DIAGNOSIS — K824 Cholesterolosis of gallbladder: Secondary | ICD-10-CM

## 2016-01-03 DIAGNOSIS — R1314 Dysphagia, pharyngoesophageal phase: Secondary | ICD-10-CM | POA: Diagnosis not present

## 2016-01-03 DIAGNOSIS — B9681 Helicobacter pylori [H. pylori] as the cause of diseases classified elsewhere: Secondary | ICD-10-CM

## 2016-01-03 DIAGNOSIS — K769 Liver disease, unspecified: Secondary | ICD-10-CM

## 2016-01-03 DIAGNOSIS — Z86010 Personal history of colonic polyps: Secondary | ICD-10-CM

## 2016-01-03 DIAGNOSIS — K219 Gastro-esophageal reflux disease without esophagitis: Secondary | ICD-10-CM

## 2016-01-03 DIAGNOSIS — A048 Other specified bacterial intestinal infections: Secondary | ICD-10-CM

## 2016-01-03 DIAGNOSIS — K7689 Other specified diseases of liver: Secondary | ICD-10-CM

## 2016-01-03 DIAGNOSIS — Z1211 Encounter for screening for malignant neoplasm of colon: Secondary | ICD-10-CM

## 2016-01-03 MED ORDER — PEG-KCL-NACL-NASULF-NA ASC-C 100 G PO SOLR: 1.0000 | ORAL | 0 refills | Status: DC

## 2016-01-03 NOTE — Progress Notes (Signed)
ON RECALL  °

## 2016-01-03 NOTE — Assessment & Plan Note (Addendum)
DISCOVERED INCIDENTALLY ON CT SEP 5.  CMP AND MRI OF LIVER AND GALLBLADDER. CHECK AFP AND HEP C Ab.

## 2016-01-03 NOTE — Assessment & Plan Note (Signed)
SYMPTOMS CONTROLLED/RESOLVED. 

## 2016-01-03 NOTE — Progress Notes (Addendum)
   Subjective:    Patient ID: Douglas Sims, male    DOB: [birth year], 60 y.o.   MRN: [id number]  Glo Herring., MD  HPI CONSTIPATION BEEN A PROBLEM FOR 2 MOS. NEEDS MOM-PRUNE JUICE LAST WEEK. USED EX-LAX AS A KID. BOWEL CHANGED USUALLY CAN EAT AND GO TO BATHROOM AFTER COFFEE. BELLY HURTS ALL THE TIME.   PT DENIES FEVER, CHILLS, HEMATOCHEZIA, HEMATEMESIS, nausea, vomiting, melena, diarrhea, CHEST PAIN, SHORTNESS OF BREATH, CHANGE IN BOWEL IN HABITS, problems swallowing, problems with sedation, OR heartburn or indigestion.   Past Medical History:  Diagnosis Date  . GERD (gastroesophageal reflux disease)   . Wears glasses     Past Surgical History:  Procedure Laterality Date  . COLONOSCOPY    . FASCIECTOMY Left 06/07/2014   Procedure: LEFT PALM FASCIECTOMY LEFT RING FINGER;  Surgeon: Daryll Brod, MD;  Location: [facility];  Service: Orthopedics;  Laterality: Left;  . PLANTAR FASCIA SURGERY  2007   left foot  . TONSILLECTOMY    . UPPER GI ENDOSCOPY     No Known Allergies   Current Outpatient Prescriptions  Medication Sig Dispense Refill  . ALPRAZolam (XANAX) 0.5 MG tablet Take 0.5 mg by mouth at bedtime as needed for sleep.    Marland Kitchen aspirin EC 81 MG tablet Take 81 mg by mouth.    . meloxicam (MOBIC) 7.5 MG tablet Take 7.5 mg by mouth.    Marland Kitchen omeprazole (PRILOSEC) 20 MG capsule TAKE ONE CAPSULE BY MOUTH TWICE A  DAY 30  MINUTES  BEFORE  BREAKFAST  And DINNER FOR  ACID  REFLUX    . Saw Palmetto, Serenoa repens, 160 MG TABS Take by mouth.    . tamsulosin (FLOMAX) 0.4 MG CAPS capsule Take 0.4 mg by mouth.    Marland Kitchen HYDROcodone-acetaminophen (NORCO) 5-325 MG per tablet Take 1 tablet by mouth every 6 (six) hours as needed for moderate pain. (Patient not taking: Reported on 01/03/2016)      FAMILY HISTORY: DAD-LUNG CANCER, NO COLON CANCER OR POLYPS.  Social History   Social History  . Marital status: Married    Spouse name: N/A  . Number of children: N/A  . Years of  education: N/A   Social History Main Topics  . Smoking status: Former Smoker    Quit date: 06/03/1993  . Smokeless tobacco: None  . Alcohol use No  . Drug use: No  . Sexual activity: Not Asked   Review of Systems PER HPI OTHERWISE ALL SYSTEMS ARE NEGATIVE.    Objective:   Physical Exam  Constitutional: He is oriented to person, place, and time. He appears well-developed and well-nourished. No distress.  HENT:  Head: Normocephalic and atraumatic.  Mouth/Throat: Oropharynx is clear and moist. No oropharyngeal exudate.  Eyes: Pupils are equal, round, and reactive to light. No scleral icterus.  Neck: Normal range of motion. Neck supple.  Cardiovascular: Normal rate, regular rhythm and normal heart sounds.   Pulmonary/Chest: Effort normal and breath sounds normal. No respiratory distress.  Abdominal: Soft. Bowel sounds are normal. He exhibits no distension. There is no tenderness.  Musculoskeletal: He exhibits no edema.  Lymphadenopathy:    He has no cervical adenopathy.  Neurological: He is alert and oriented to person, place, and time.  NO  NEW FOCAL DEFICITS  Psychiatric:  SLIGHTLY ANXIOUS MOOD, NL AFFECT  Vitals reviewed.      Assessment & Plan:

## 2016-01-03 NOTE — Assessment & Plan Note (Signed)
Personal history of polyps. Last tcs > 5 yrs.  TCS MON SEP 18. DISCUSSED PROCEDURE, BENEFITS, & RISKS: < 1% chance of medication reaction, bleeding, perforation, or rupture of spleen/liver. PHENERGAN 12.5 MG IV IN PREOP SUPREP

## 2016-01-03 NOTE — Addendum Note (Signed)
Addended by: Danie Binder on: 01/03/2016 02:55 PM   Modules accepted: Orders

## 2016-01-03 NOTE — Assessment & Plan Note (Signed)
DISCOVERED INCIDENTALLY.  CMP AND MRI OF GB

## 2016-01-03 NOTE — Assessment & Plan Note (Signed)
SYMPTOMS CONTROLLED/RESOLVED.  CONTINUE TO MONITOR SYMPTOMS. 

## 2016-01-03 NOTE — Progress Notes (Signed)
cc'ed to pcp °

## 2016-01-03 NOTE — Assessment & Plan Note (Addendum)
BREATH TEST TO CONFIRM ERADICATION

## 2016-01-04 ENCOUNTER — Other Ambulatory Visit: Payer: Self-pay

## 2016-01-04 ENCOUNTER — Telehealth: Payer: Self-pay | Admitting: Gastroenterology

## 2016-01-04 ENCOUNTER — Other Ambulatory Visit: Payer: Self-pay | Admitting: Gastroenterology

## 2016-01-04 DIAGNOSIS — Z1211 Encounter for screening for malignant neoplasm of colon: Secondary | ICD-10-CM

## 2016-01-04 DIAGNOSIS — K769 Liver disease, unspecified: Secondary | ICD-10-CM

## 2016-01-04 MED ORDER — LINACLOTIDE 145 MCG PO CAPS
ORAL_CAPSULE | ORAL | 11 refills | Status: DC
Start: 2016-01-04 — End: 2020-04-20

## 2016-01-04 NOTE — Progress Notes (Signed)
Note placed at front on the prep that pt is to come by and pick up.

## 2016-01-04 NOTE — Progress Notes (Signed)
PLEASE CALL PT. Rx for Linzess sent.

## 2016-01-04 NOTE — Telephone Encounter (Signed)
Talked with his wife Otila Kluver and she is going to come by the office to pick up sample.

## 2016-01-04 NOTE — Telephone Encounter (Signed)
Please call patient about prep kit [phone]

## 2016-01-04 NOTE — Addendum Note (Signed)
Addended by: Danie Binder on: 01/04/2016 02:20 PM   Modules accepted: Orders

## 2016-01-08 ENCOUNTER — Other Ambulatory Visit: Payer: Self-pay

## 2016-01-09 DIAGNOSIS — K7689 Other specified diseases of liver: Secondary | ICD-10-CM | POA: Diagnosis not present

## 2016-01-10 ENCOUNTER — Telehealth: Payer: Self-pay | Admitting: Gastroenterology

## 2016-01-10 ENCOUNTER — Ambulatory Visit (HOSPITAL_COMMUNITY)
Admission: RE | Admit: 2016-01-10 | Discharge: 2016-01-10 | Disposition: A | Discharge status: Home / Self Care | Payer: BLUE CROSS/BLUE SHIELD | Source: Ambulatory Visit | Attending: Gastroenterology | Admitting: Gastroenterology

## 2016-01-10 DIAGNOSIS — N281 Cyst of kidney, acquired: Secondary | ICD-10-CM | POA: Insufficient documentation

## 2016-01-10 DIAGNOSIS — K769 Liver disease, unspecified: Secondary | ICD-10-CM

## 2016-01-10 DIAGNOSIS — K7689 Other specified diseases of liver: Secondary | ICD-10-CM | POA: Insufficient documentation

## 2016-01-10 DIAGNOSIS — K760 Fatty (change of) liver, not elsewhere classified: Secondary | ICD-10-CM | POA: Insufficient documentation

## 2016-01-10 LAB — COMPLETE METABOLIC PANEL WITH GFR
ALK PHOS: 71 U/L (ref 40–115)
ALT: 20 U/L (ref 9–46)
AST: 15 U/L (ref 10–35)
Albumin: 3.8 g/dL (ref 3.6–5.1)
BUN: 11 mg/dL (ref 7–25)
CHLORIDE: 106 mmol/L (ref 98–110)
CO2: 25 mmol/L (ref 20–31)
Calcium: 8.4 mg/dL — ABNORMAL LOW (ref 8.6–10.3)
Creat: 0.82 mg/dL (ref 0.70–1.25)
GFR, Est African American: >89 mL/min (ref >=60)
GFR, Est Non African American: >89 mL/min (ref >=60)
Glucose, Bld: 129 mg/dL — ABNORMAL HIGH (ref 65–99)
POTASSIUM: 4.1 mmol/L (ref 3.5–5.3)
Sodium: 139 mmol/L (ref 135–146)
Total Bilirubin: 1 mg/dL (ref 0.2–1.2)
Total Protein: 6.4 g/dL (ref 6.1–8.1)

## 2016-01-10 LAB — HEPATITIS C ANTIBODY: HCV Ab: NEGATIVE

## 2016-01-10 LAB — AFP TUMOR MARKER: AFP TUMOR MARKER: 6.4 ng/mL — AB (ref <6.1)

## 2016-01-10 MED ORDER — GADOBENATE DIMEGLUMINE 529 MG/ML IV SOLN
20.0000 mL | Freq: Once | INTRAVENOUS | Status: AC | PRN
  Administered 2016-01-10: 20 mL via INTRAVENOUS

## 2016-01-10 NOTE — Telephone Encounter (Signed)
LMOM to call.

## 2016-01-10 NOTE — Telephone Encounter (Signed)
PT is aware.

## 2016-01-10 NOTE — Telephone Encounter (Addendum)
PLEASE CALL PT. HIS MRI SHOWS CYSTS IN HIS LIVER AND A CYST ON HIS KIDNEY. NO ADDITIONAL WORKUP IS NEEDED.

## 2016-01-14 ENCOUNTER — Telehealth: Payer: Self-pay | Admitting: Gastroenterology

## 2016-01-14 NOTE — Telephone Encounter (Signed)
PLEASE CALL PT. IS LIVER PANEL IS NORMAL. HIS HEP C Ab IS NEGATIVE. HIS AFP WHICH IS A TUMOR MARKER IS 6.4(NL IS 6.1). THIS VALUE IS NOT CLINICALLY RELEVANT. COMPLETE TCS SEP 19.

## 2016-01-15 ENCOUNTER — Telehealth: Payer: Self-pay | Admitting: Gastroenterology

## 2016-01-15 DIAGNOSIS — R918 Other nonspecific abnormal finding of lung field: Secondary | ICD-10-CM | POA: Insufficient documentation

## 2016-01-15 DIAGNOSIS — D090 Carcinoma in situ of bladder: Secondary | ICD-10-CM | POA: Diagnosis not present

## 2016-01-15 NOTE — Telephone Encounter (Signed)
LMOM for a return call.  

## 2016-01-15 NOTE — Telephone Encounter (Signed)
Pt's wife, Otila Kluver, called this morning asking if [facility] would write an order for the patient to have a scan of his lungs because he has 2 nodules there. Please advise and call [phone]

## 2016-01-15 NOTE — Telephone Encounter (Signed)
LMOM to call.

## 2016-01-16 ENCOUNTER — Encounter (HOSPITAL_COMMUNITY): Payer: Self-pay

## 2016-01-16 ENCOUNTER — Ambulatory Visit (HOSPITAL_COMMUNITY): Admit: 2016-01-16 | Payer: BLUE CROSS/BLUE SHIELD | Admitting: Internal Medicine

## 2016-01-16 ENCOUNTER — Encounter (HOSPITAL_COMMUNITY)
Admission: RE | Disposition: A | Discharge status: Home / Self Care | Payer: Self-pay | Source: Ambulatory Visit | Attending: Gastroenterology

## 2016-01-16 ENCOUNTER — Ambulatory Visit (HOSPITAL_COMMUNITY)
Admission: RE | Admit: 2016-01-16 | Discharge: 2016-01-16 | Disposition: A | Discharge status: Home / Self Care | Payer: BLUE CROSS/BLUE SHIELD | Source: Ambulatory Visit | Attending: Gastroenterology | Admitting: Gastroenterology

## 2016-01-16 DIAGNOSIS — D125 Benign neoplasm of sigmoid colon: Secondary | ICD-10-CM

## 2016-01-16 DIAGNOSIS — K648 Other hemorrhoids: Secondary | ICD-10-CM | POA: Diagnosis not present

## 2016-01-16 DIAGNOSIS — K219 Gastro-esophageal reflux disease without esophagitis: Secondary | ICD-10-CM | POA: Diagnosis not present

## 2016-01-16 DIAGNOSIS — Z86010 Personal history of colonic polyps: Secondary | ICD-10-CM | POA: Diagnosis not present

## 2016-01-16 DIAGNOSIS — K635 Polyp of colon: Secondary | ICD-10-CM | POA: Diagnosis not present

## 2016-01-16 DIAGNOSIS — Q438 Other specified congenital malformations of intestine: Secondary | ICD-10-CM | POA: Insufficient documentation

## 2016-01-16 DIAGNOSIS — Z87891 Personal history of nicotine dependence: Secondary | ICD-10-CM | POA: Diagnosis not present

## 2016-01-16 DIAGNOSIS — Z7982 Long term (current) use of aspirin: Secondary | ICD-10-CM | POA: Insufficient documentation

## 2016-01-16 DIAGNOSIS — Z79899 Other long term (current) drug therapy: Secondary | ICD-10-CM | POA: Diagnosis not present

## 2016-01-16 DIAGNOSIS — K5900 Constipation, unspecified: Secondary | ICD-10-CM | POA: Insufficient documentation

## 2016-01-16 DIAGNOSIS — Z1211 Encounter for screening for malignant neoplasm of colon: Secondary | ICD-10-CM

## 2016-01-16 HISTORY — PX: COLONOSCOPY: SHX5424

## 2016-01-16 HISTORY — DX: Essential (primary) hypertension: I10

## 2016-01-16 SURGERY — COLONOSCOPY
Anesthesia: Moderate Sedation

## 2016-01-16 MED ORDER — MEPERIDINE HCL 100 MG/ML IJ SOLN
INTRAMUSCULAR | Status: AC
  Filled 2016-01-16: qty 2

## 2016-01-16 MED ORDER — SODIUM CHLORIDE 0.9% FLUSH
INTRAVENOUS | Status: AC
  Filled 2016-01-16: qty 10

## 2016-01-16 MED ORDER — SODIUM CHLORIDE 0.9 % IV SOLN
INTRAVENOUS | Status: DC
  Administered 2016-01-16: 14:00:00 via INTRAVENOUS

## 2016-01-16 MED ORDER — PROMETHAZINE HCL 25 MG/ML IJ SOLN
INTRAMUSCULAR | Status: AC
  Filled 2016-01-16: qty 1

## 2016-01-16 MED ORDER — PROMETHAZINE HCL 25 MG/ML IJ SOLN
12.5000 mg | Freq: Once | INTRAMUSCULAR | Status: AC
  Administered 2016-01-16: 12.5 mg via INTRAVENOUS

## 2016-01-16 MED ORDER — MIDAZOLAM HCL 5 MG/5ML IJ SOLN
INTRAMUSCULAR | Status: DC | PRN
  Administered 2016-01-16 (×2): 2 mg via INTRAVENOUS
  Administered 2016-01-16: 1 mg via INTRAVENOUS

## 2016-01-16 MED ORDER — STERILE WATER FOR IRRIGATION IR SOLN
Status: DC | PRN
  Administered 2016-01-16: 100 mL

## 2016-01-16 MED ORDER — MIDAZOLAM HCL 5 MG/5ML IJ SOLN
INTRAMUSCULAR | Status: AC
  Filled 2016-01-16: qty 10

## 2016-01-16 MED ORDER — MEPERIDINE HCL 100 MG/ML IJ SOLN
INTRAMUSCULAR | Status: DC | PRN
  Administered 2016-01-16: 50 mg via INTRAVENOUS
  Administered 2016-01-16 (×2): 25 mg via INTRAVENOUS

## 2016-01-16 NOTE — Telephone Encounter (Signed)
PLEASE CALL PT. HE WAS SUPPOSE TO HAVE HIS COLONOSCOPY TODAY. I REVIEWED HIS MRI. I DO NOT SEE ANY NODULES ON THE MRI. HE WOULD NEED DR. DAVIS TO CONTACT HIS PCP.

## 2016-01-16 NOTE — Telephone Encounter (Signed)
PT's wife is aware.

## 2016-01-16 NOTE — Telephone Encounter (Signed)
I spoke to Douglas Sims ans she said Dr. Rosana Hoes wanted them to ask Dr. Oneida Alar if she would order an MRI to follow up on the lung nodules.  Please advise!

## 2016-01-16 NOTE — H&P (View-Only) (Signed)
   Subjective:    Patient ID: Douglas Sims, male    DOB: [birth year], 60 y.o.   MRN: [id number]  Glo Herring., MD  HPI CONSTIPATION BEEN A PROBLEM FOR 2 MOS. NEEDS MOM-PRUNE JUICE LAST WEEK. USED EX-LAX AS A KID. BOWEL CHANGED USUALLY CAN EAT AND GO TO BATHROOM AFTER COFFEE. BELLY HURTS ALL THE TIME.   PT DENIES FEVER, CHILLS, HEMATOCHEZIA, HEMATEMESIS, nausea, vomiting, melena, diarrhea, CHEST PAIN, SHORTNESS OF BREATH, CHANGE IN BOWEL IN HABITS, problems swallowing, problems with sedation, OR heartburn or indigestion.   Past Medical History:  Diagnosis Date  . GERD (gastroesophageal reflux disease)   . Wears glasses     Past Surgical History:  Procedure Laterality Date  . COLONOSCOPY    . FASCIECTOMY Left 06/07/2014   Procedure: LEFT PALM FASCIECTOMY LEFT RING FINGER;  Surgeon: Daryll Brod, MD;  Location: [facility];  Service: Orthopedics;  Laterality: Left;  . PLANTAR FASCIA SURGERY  2007   left foot  . TONSILLECTOMY    . UPPER GI ENDOSCOPY     No Known Allergies   Current Outpatient Prescriptions  Medication Sig Dispense Refill  . ALPRAZolam (XANAX) 0.5 MG tablet Take 0.5 mg by mouth at bedtime as needed for sleep.    Marland Kitchen aspirin EC 81 MG tablet Take 81 mg by mouth.    . meloxicam (MOBIC) 7.5 MG tablet Take 7.5 mg by mouth.    Marland Kitchen omeprazole (PRILOSEC) 20 MG capsule TAKE ONE CAPSULE BY MOUTH TWICE A  DAY 30  MINUTES  BEFORE  BREAKFAST  And DINNER FOR  ACID  REFLUX    . Saw Palmetto, Serenoa repens, 160 MG TABS Take by mouth.    . tamsulosin (FLOMAX) 0.4 MG CAPS capsule Take 0.4 mg by mouth.    Marland Kitchen HYDROcodone-acetaminophen (NORCO) 5-325 MG per tablet Take 1 tablet by mouth every 6 (six) hours as needed for moderate pain. (Patient not taking: Reported on 01/03/2016)      FAMILY HISTORY: DAD-LUNG CANCER, NO COLON CANCER OR POLYPS.  Social History   Social History  . Marital status: Married    Spouse name: N/A  . Number of children: N/A  . Years of  education: N/A   Social History Main Topics  . Smoking status: Former Smoker    Quit date: 06/03/1993  . Smokeless tobacco: None  . Alcohol use No  . Drug use: No  . Sexual activity: Not Asked   Review of Systems PER HPI OTHERWISE ALL SYSTEMS ARE NEGATIVE.    Objective:   Physical Exam  Constitutional: He is oriented to person, place, and time. He appears well-developed and well-nourished. No distress.  HENT:  Head: Normocephalic and atraumatic.  Mouth/Throat: Oropharynx is clear and moist. No oropharyngeal exudate.  Eyes: Pupils are equal, round, and reactive to light. No scleral icterus.  Neck: Normal range of motion. Neck supple.  Cardiovascular: Normal rate, regular rhythm and normal heart sounds.   Pulmonary/Chest: Effort normal and breath sounds normal. No respiratory distress.  Abdominal: Soft. Bowel sounds are normal. He exhibits no distension. There is no tenderness.  Musculoskeletal: He exhibits no edema.  Lymphadenopathy:    He has no cervical adenopathy.  Neurological: He is alert and oriented to person, place, and time.  NO  NEW FOCAL DEFICITS  Psychiatric:  SLIGHTLY ANXIOUS MOOD, NL AFFECT  Vitals reviewed.      Assessment & Plan:

## 2016-01-16 NOTE — Addendum Note (Signed)
Addended by: Danie Binder on: 01/16/2016 01:16 PM   Modules accepted: Orders

## 2016-01-16 NOTE — Op Note (Signed)
[facility] Patient Name: Douglas Sims Procedure Date: 01/16/2016 1:38 PM MRN: [id number] Date of Birth: 10/17/1955 Attending MD: Barney Drain , MD CSN: [id number] Age: 60 Admit Type: Outpatient Procedure:                Colonoscopy WITH COLD FORCEPS POLYPECTOMY Indications:              High risk colon cancer surveillance: Personal                            history of colonic polyps-SIMPLE ADENOMA REMOVED IN                            2011 Providers:                Barney Drain, MD, Rosina Lowenstein, RN, Isabella Stalling,                            Technician Referring MD:             Redmond School, MD Medicines:                Promethazine 12.5 mg IV, Meperidine 100 mg IV,                            Midazolam 5 mg IV Complications:            No immediate complications. Estimated Blood Loss:     Estimated blood loss was minimal. Procedure:                Pre-Anesthesia Assessment:                           - Prior to the procedure, a History and Physical                            was performed, and patient medications and                            allergies were reviewed. The patient's tolerance of                            previous anesthesia was also reviewed. The risks                            and benefits of the procedure and the sedation                            options and risks were discussed with the patient.                            All questions were answered, and informed consent                            was obtained. Prior Anticoagulants: The patient has  taken aspirin, last dose was day of procedure. ASA                            Grade Assessment: II - A patient with mild systemic                            disease. After reviewing the risks and benefits,                            the patient was deemed in satisfactory condition to                            undergo the procedure. After obtaining informed     consent, the colonoscope was passed under direct                            vision. Throughout the procedure, the patient's                            blood pressure, pulse, and oxygen saturations were                            monitored continuously. The [id number]                            scope was introduced through the anus and advanced                            to the the cecum, identified by appendiceal orifice                            and ileocecal valve. The colonoscopy was somewhat                            difficult due to a tortuous colon. Successful                            completion of the procedure was aided by COLOWRAP.                            The patient tolerated the procedure well. The                            quality of the bowel preparation was good. The                            ileocecal valve, appendiceal orifice, and rectum                            were photographed. Scope In: 2:24:15 PM Scope Out: 2:45:24 PM Scope Withdrawal Time: 0 hours 17 minutes 52 seconds  Total Procedure Duration: 0 hours 21 minutes 9 seconds  Findings:      Non-bleeding external and internal hemorrhoids  were found. The       hemorrhoids were moderate.      A 3 mm polyp was found in the sigmoid colon. The polyp was sessile. The       polyp was removed with a cold biopsy forceps. Resection and retrieval       were complete.      The recto-sigmoid colon and sigmoid colon were mildly redundant. AND       COLOWRAP Impression:               - Non-bleeding external and internal hemorrhoids.                           - One 3 mm polyp in the sigmoid colon, removed with                            a cold biopsy forceps. Resected and retrieved.                           - Redundant LEFT colon. Moderate Sedation:      Moderate (conscious) sedation was administered by the endoscopy nurse       and supervised by the endoscopist. The following parameters were        monitored: oxygen saturation, heart rate, blood pressure, and response       to care. Total physician intraservice time was 35 minutes. Recommendation:           - High fiber diet.                           - Continue present medications.                           - Await pathology results.                           - Repeat colonoscopy in 5-10 years for surveillance.                           - Return to my office in 6 months.                           -COMPLETE H PYLORI BREATH TEST.                           - Patient has a contact number available for                            emergencies. The signs and symptoms of potential                            delayed complications were discussed with the                            patient. Return to normal activities tomorrow.                            Written discharge  instructions were provided to the                            patient. Procedure Code(s):        --- Professional ---                           [phone], Colonoscopy, flexible; with biopsy, single                            or multiple                           99152, Moderate sedation services provided by the                            same physician or other qualified health care                            professional performing the diagnostic or                            therapeutic service that the sedation supports,                            requiring the presence of an independent trained                            observer to assist in the monitoring of the                            patient's level of consciousness and physiological                            status; initial 15 minutes of intraservice time,                            patient age 26 years or older                           [phone], Moderate sedation services; each additional                            15 minutes intraservice time Diagnosis Code(s):        --- Professional ---                            Z86.010, Personal history of colonic polyps                           D12.5, Benign neoplasm of sigmoid colon                           K64.8, Other hemorrhoids  Q43.8, Other specified congenital malformations of                            intestine CPT copyright 2016 [employer]. All rights reserved. The codes documented in this report are preliminary and upon coder review may  be revised to meet current compliance requirements. Barney Drain, MD Barney Drain, MD 01/16/2016 3:06:06 PM This report has been signed electronically. Number of Addenda: 0

## 2016-01-16 NOTE — Discharge Instructions (Signed)
You had 1 polyp removed. You have internal AND EXTERNAL hemorrhoids. 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   CONTINUE YOUR WEIGHT LOSS EFFORTS. LOSE TEN POUNDS.  DRINK WATER TO KEEP YOUR URINE LIGHT YELLOW.  FOLLOW A HIGH FIBER DIET. AVOID ITEMS THAT CAUSE BLOATING & GAS. SEE INFO BELOW.  CONTINUE LINZESS.  CONTINUE OMEPRAZOLE.  TAKE 30 MINUTES PRIOR TO YOUR MEALS TWICE DAILY.  YOUR BIOPSY RESULTS WILL BE AVAILABLE IN MY CHART AFTER SEP 22 AND MY OFFICE WILL CONTACT YOU IN 10-14 DAYS WITH YOUR RESULTS.   Complete H PYLORI BREATH TEST. CALL MY OFFICE FOR INSTRUCTIONS.  FOLLOW UP IN 6 MOS.   Next colonoscopy in 5-10 years.    Colonoscopy Care After Read the instructions outlined below and refer to this sheet in the next week. These discharge instructions provide you with general information on caring for yourself after you leave the hospital. While your treatment has been planned according to the most current medical practices available, unavoidable complications occasionally occur. If you have any problems or questions after discharge, call DR. Alyxander Kollmann, [phone].  ACTIVITY  You may resume your regular activity, but move at a slower pace for the next 24 hours.   Take frequent rest periods for the next 24 hours.   Walking will help get rid of the air and reduce the bloated feeling in your belly (abdomen).   No driving for 24 hours (because of the medicine (anesthesia) used during the test).   You may shower.   Do not sign any important legal documents or operate any machinery for 24 hours (because of the anesthesia used during the test).    NUTRITION  Drink plenty of fluids.   You may resume your normal diet as instructed by your doctor.   Begin with a light meal and progress to your normal diet. Heavy or fried foods are harder to digest and may make you feel sick to your stomach (nauseated).   Avoid alcoholic beverages for 24 hours or as instructed.     MEDICATIONS  You may resume your normal medications.   WHAT YOU CAN EXPECT TODAY  Some feelings of bloating in the abdomen.   Passage of more gas than usual.   Spotting of blood in your stool or on the toilet paper  .  IF YOU HAD POLYPS REMOVED DURING THE COLONOSCOPY:  Eat a soft diet IF YOU HAVE NAUSEA, BLOATING, ABDOMINAL PAIN, OR VOMITING.    FINDING OUT THE RESULTS OF YOUR TEST Not all test results are available during your visit. DR. Oneida Alar WILL CALL YOU WITHIN 14 DAYS OF YOUR PROCEDUE WITH YOUR RESULTS. Do not assume everything is normal if you have not heard from DR. Hilario Robarts, CALL HER OFFICE AT [phone].  SEEK IMMEDIATE MEDICAL ATTENTION AND CALL THE OFFICE: [phone] IF:  You have more than a spotting of blood in your stool.   Your belly is swollen (abdominal distention).   You are nauseated or vomiting.   You have a temperature over 101F.   You have abdominal pain or discomfort that is severe or gets worse throughout the day.   High-Fiber Diet A high-fiber diet changes your normal diet to include more whole grains, legumes, fruits, and vegetables. Changes in the diet involve replacing refined carbohydrates with unrefined foods. The calorie level of the diet is essentially unchanged. The Dietary Reference Intake (recommended amount) for adult males is 38 grams per day. For adult females, it is 25 grams per day. Pregnant and  lactating women should consume 28 grams of fiber per day. Fiber is the intact part of a plant that is not broken down during digestion. Functional fiber is fiber that has been isolated from the plant to provide a beneficial effect in the body. PURPOSE  Increase stool bulk.   Ease and regulate bowel movements.   Lower cholesterol.   REDUCE RISK OF COLON CANCER  INDICATIONS THAT YOU NEED MORE FIBER  Constipation and hemorrhoids.   Uncomplicated diverticulosis (intestine condition) and irritable bowel syndrome.   Weight  management.   As a protective measure against hardening of the arteries (atherosclerosis), diabetes, and cancer.   GUIDELINES FOR INCREASING FIBER IN THE DIET  Start adding fiber to the diet slowly. A gradual increase of about 5 more grams (2 slices of whole-wheat bread, 2 servings of most fruits or vegetables, or 1 bowl of high-fiber cereal) per day is best. Too rapid an increase in fiber may result in constipation, flatulence, and bloating.   Drink enough water and fluids to keep your urine clear or pale yellow. Water, juice, or caffeine-free drinks are recommended. Not drinking enough fluid may cause constipation.   Eat a variety of high-fiber foods rather than one type of fiber.   Try to increase your intake of fiber through using high-fiber foods rather than fiber pills or supplements that contain small amounts of fiber.   The goal is to change the types of food eaten. Do not supplement your present diet with high-fiber foods, but replace foods in your present diet.   INCLUDE A VARIETY OF FIBER SOURCES  Replace refined and processed grains with whole grains, canned fruits with fresh fruits, and incorporate other fiber sources. White rice, white breads, and most bakery goods contain little or no fiber.   Brown whole-grain rice, buckwheat oats, and many fruits and vegetables are all good sources of fiber. These include: broccoli, Brussels sprouts, cabbage, cauliflower, beets, sweet potatoes, white potatoes (skin on), carrots, tomatoes, eggplant, squash, berries, fresh fruits, and dried fruits.   Cereals appear to be the richest source of fiber. Cereal fiber is found in whole grains and bran. Bran is the fiber-rich outer coat of cereal grain, which is largely removed in refining. In whole-grain cereals, the bran remains. In breakfast cereals, the largest amount of fiber is found in those with "bran" in their names. The fiber content is sometimes indicated on the label.   You may need to  include additional fruits and vegetables each day.   In baking, for 1 cup white flour, you may use the following substitutions:   1 cup whole-wheat flour minus 2 tablespoons.   1/2 cup white flour plus 1/2 cup whole-wheat flour.   Polyps, Colon  A polyp is extra tissue that grows inside your body. Colon polyps grow in the large intestine. The large intestine, also called the colon, is part of your digestive system. It is a long, hollow tube at the end of your digestive tract where your body makes and stores stool. Most polyps are not dangerous. They are benign. This means they are not cancerous. But over time, some types of polyps can turn into cancer. Polyps that are smaller than a pea are usually not harmful. But larger polyps could someday become or may already be cancerous. To be safe, doctors remove all polyps and test them.   PREVENTION There is not one sure way to prevent polyps. You might be able to lower your risk of getting them if you:  Eat more fruits and vegetables and less fatty food.   Do not smoke.   Avoid alcohol.   Exercise every day.   Lose weight if you are overweight.   Eating more calcium and folate can also lower your risk of getting polyps. Some foods that are rich in calcium are milk, cheese, and broccoli. Some foods that are rich in folate are chickpeas, kidney beans, and spinach.   Hemorrhoids Hemorrhoids are dilated (enlarged) veins around the rectum. Sometimes clots will form in the veins. This makes them swollen and painful. These are called thrombosed hemorrhoids. Causes of hemorrhoids include:  Constipation.   Straining to have a bowel movement.   HEAVY LIFTING  HOME CARE INSTRUCTIONS  Eat a well balanced diet and drink 6 to 8 glasses of water every day to avoid constipation. You may also use a bulk laxative.   Avoid straining to have bowel movements.   Keep anal area dry and clean.   Do not use a donut shaped pillow or sit on the toilet  for long periods. This increases blood pooling and pain.   Move your bowels when your body has the urge; this will require less straining and will decrease pain and pressure.   Diverticulosis Diverticulosis is a common condition that develops when small pouches (diverticula) form in the wall of the colon. The risk of diverticulosis increases with age. It happens more often in people who eat a low-fiber diet. Most individuals with diverticulosis have no symptoms. Those individuals with symptoms usually experience belly (abdominal) pain, constipation, or loose stools (diarrhea).  HOME CARE INSTRUCTIONS  Increase the amount of fiber in your diet as directed by your caregiver or dietician. This may reduce symptoms of diverticulosis.   Drink at least 6 to 8 glasses of water each day to prevent constipation.   Try not to strain when you have a bowel movement.   Avoiding nuts and seeds to prevent complications is NOT NECESSARY.   FOODS HAVING HIGH FIBER CONTENT INCLUDE:  Fruits. Apple, peach, pear, tangerine, raisins, prunes.   Vegetables. Brussels sprouts, asparagus, broccoli, cabbage, carrot, cauliflower, romaine lettuce, spinach, summer squash, tomato, winter squash, zucchini.   Starchy Vegetables. Baked beans, kidney beans, lima beans, split peas, lentils, potatoes (with skin).   Grains. Whole wheat bread, brown rice, bran flake cereal, plain oatmeal, white rice, shredded wheat, bran muffins.

## 2016-01-16 NOTE — Interval H&P Note (Signed)
History and Physical Interval Note:  01/16/2016 1:17 PM  Douglas Sims  has presented today for surgery, with the diagnosis of screening  The various methods of treatment have been discussed with the patient and family. After consideration of risks, benefits and other options for treatment, the patient has consented to  Procedure(s) with comments: COLONOSCOPY (N/A) - 230 as a surgical intervention .  The patient's history has been reviewed, patient examined, no change in status, stable for surgery.  I have reviewed the patient's chart and labs.  Questions were answered to the patient's satisfaction.     [employer]

## 2016-01-16 NOTE — Telephone Encounter (Signed)
PT is aware.

## 2016-01-16 NOTE — Telephone Encounter (Signed)
LMOM to call.

## 2016-01-18 DIAGNOSIS — K219 Gastro-esophageal reflux disease without esophagitis: Secondary | ICD-10-CM | POA: Diagnosis not present

## 2016-01-18 DIAGNOSIS — M1991 Primary osteoarthritis, unspecified site: Secondary | ICD-10-CM | POA: Diagnosis not present

## 2016-01-18 DIAGNOSIS — F419 Anxiety disorder, unspecified: Secondary | ICD-10-CM | POA: Diagnosis not present

## 2016-01-18 DIAGNOSIS — Z6829 Body mass index (BMI) 29.0-29.9, adult: Secondary | ICD-10-CM | POA: Diagnosis not present

## 2016-01-18 DIAGNOSIS — Z1389 Encounter for screening for other disorder: Secondary | ICD-10-CM | POA: Diagnosis not present

## 2016-01-18 DIAGNOSIS — K5901 Slow transit constipation: Secondary | ICD-10-CM | POA: Diagnosis not present

## 2016-01-22 ENCOUNTER — Telehealth: Payer: Self-pay | Admitting: Gastroenterology

## 2016-01-22 NOTE — Telephone Encounter (Signed)
Pt is aware of result and we will get his schedule for the H pylori test

## 2016-01-22 NOTE — Telephone Encounter (Signed)
Please call pt. He had A HYPERPLASTIC POLYP removed.   CONTINUE YOUR WEIGHT LOSS EFFORTS. LOSE TEN POUNDS.  DRINK WATER TO KEEP YOUR URINE LIGHT YELLOW.  FOLLOW A HIGH FIBER DIET. AVOID ITEMS THAT CAUSE BLOATING & GAS.   CONTINUE LINZESS.  CONTINUE OMEPRAZOLE.  TAKE 30 MINUTES PRIOR TO YOUR MEALS TWICE DAILY.  Complete H PYLORI BREATH TEST.   FOLLOW UP IN 6 MOS E30 H PYLORI GASTRITIS.   Next colonoscopy in 10 years.

## 2016-01-23 ENCOUNTER — Encounter (HOSPITAL_COMMUNITY): Payer: Self-pay | Admitting: Gastroenterology

## 2016-01-23 DIAGNOSIS — K219 Gastro-esophageal reflux disease without esophagitis: Secondary | ICD-10-CM | POA: Diagnosis not present

## 2016-01-23 DIAGNOSIS — D090 Carcinoma in situ of bladder: Secondary | ICD-10-CM | POA: Diagnosis not present

## 2016-01-23 DIAGNOSIS — Z08 Encounter for follow-up examination after completed treatment for malignant neoplasm: Secondary | ICD-10-CM | POA: Diagnosis not present

## 2016-01-23 DIAGNOSIS — Z87891 Personal history of nicotine dependence: Secondary | ICD-10-CM | POA: Diagnosis not present

## 2016-01-23 DIAGNOSIS — Z791 Long term (current) use of non-steroidal anti-inflammatories (NSAID): Secondary | ICD-10-CM | POA: Diagnosis not present

## 2016-01-23 DIAGNOSIS — Z79891 Long term (current) use of opiate analgesic: Secondary | ICD-10-CM | POA: Diagnosis not present

## 2016-01-23 DIAGNOSIS — Z79899 Other long term (current) drug therapy: Secondary | ICD-10-CM | POA: Diagnosis not present

## 2016-01-23 DIAGNOSIS — N486 Induration penis plastica: Secondary | ICD-10-CM | POA: Diagnosis not present

## 2016-01-23 DIAGNOSIS — N3020 Other chronic cystitis without hematuria: Secondary | ICD-10-CM | POA: Diagnosis not present

## 2016-01-23 DIAGNOSIS — Z8551 Personal history of malignant neoplasm of bladder: Secondary | ICD-10-CM | POA: Diagnosis not present

## 2016-01-23 DIAGNOSIS — F419 Anxiety disorder, unspecified: Secondary | ICD-10-CM | POA: Diagnosis not present

## 2016-01-23 DIAGNOSIS — N3010 Interstitial cystitis (chronic) without hematuria: Secondary | ICD-10-CM | POA: Diagnosis not present

## 2016-01-23 DIAGNOSIS — N400 Enlarged prostate without lower urinary tract symptoms: Secondary | ICD-10-CM | POA: Diagnosis not present

## 2016-01-23 NOTE — Telephone Encounter (Signed)
ON RECALL FOR OFFICE VISIT AND TCS

## 2016-01-26 NOTE — Telephone Encounter (Signed)
Order has been faxed and he will go Monday morning

## 2016-03-05 DIAGNOSIS — C672 Malignant neoplasm of lateral wall of bladder: Secondary | ICD-10-CM | POA: Diagnosis not present

## 2016-03-05 DIAGNOSIS — R3912 Poor urinary stream: Secondary | ICD-10-CM | POA: Insufficient documentation

## 2016-03-05 DIAGNOSIS — N401 Enlarged prostate with lower urinary tract symptoms: Secondary | ICD-10-CM | POA: Insufficient documentation

## 2016-03-05 DIAGNOSIS — N486 Induration penis plastica: Secondary | ICD-10-CM | POA: Diagnosis not present

## 2016-03-28 DIAGNOSIS — N401 Enlarged prostate with lower urinary tract symptoms: Secondary | ICD-10-CM | POA: Diagnosis not present

## 2016-03-28 DIAGNOSIS — N486 Induration penis plastica: Secondary | ICD-10-CM | POA: Diagnosis not present

## 2016-03-28 DIAGNOSIS — Z79899 Other long term (current) drug therapy: Secondary | ICD-10-CM | POA: Diagnosis not present

## 2016-03-28 DIAGNOSIS — K219 Gastro-esophageal reflux disease without esophagitis: Secondary | ICD-10-CM | POA: Diagnosis not present

## 2016-03-28 DIAGNOSIS — Z8551 Personal history of malignant neoplasm of bladder: Secondary | ICD-10-CM | POA: Diagnosis not present

## 2016-03-28 DIAGNOSIS — Z7982 Long term (current) use of aspirin: Secondary | ICD-10-CM | POA: Diagnosis not present

## 2016-03-28 DIAGNOSIS — Z87891 Personal history of nicotine dependence: Secondary | ICD-10-CM | POA: Diagnosis not present

## 2016-03-28 DIAGNOSIS — N529 Male erectile dysfunction, unspecified: Secondary | ICD-10-CM | POA: Diagnosis not present

## 2016-03-28 DIAGNOSIS — F419 Anxiety disorder, unspecified: Secondary | ICD-10-CM | POA: Diagnosis not present

## 2016-03-28 DIAGNOSIS — R3912 Poor urinary stream: Secondary | ICD-10-CM | POA: Diagnosis not present

## 2016-05-01 DIAGNOSIS — Z6830 Body mass index (BMI) 30.0-30.9, adult: Secondary | ICD-10-CM | POA: Diagnosis not present

## 2016-05-01 DIAGNOSIS — F419 Anxiety disorder, unspecified: Secondary | ICD-10-CM | POA: Diagnosis not present

## 2016-05-01 DIAGNOSIS — M1991 Primary osteoarthritis, unspecified site: Secondary | ICD-10-CM | POA: Diagnosis not present

## 2016-05-01 DIAGNOSIS — K219 Gastro-esophageal reflux disease without esophagitis: Secondary | ICD-10-CM | POA: Diagnosis not present

## 2016-05-01 DIAGNOSIS — Z1389 Encounter for screening for other disorder: Secondary | ICD-10-CM | POA: Diagnosis not present

## 2016-06-03 ENCOUNTER — Encounter: Payer: Self-pay | Admitting: Gastroenterology

## 2016-06-10 ENCOUNTER — Encounter: Payer: Self-pay | Admitting: Gastroenterology

## 2016-06-18 DIAGNOSIS — Z6829 Body mass index (BMI) 29.0-29.9, adult: Secondary | ICD-10-CM | POA: Diagnosis not present

## 2016-06-18 DIAGNOSIS — H6123 Impacted cerumen, bilateral: Secondary | ICD-10-CM | POA: Diagnosis not present

## 2016-06-18 DIAGNOSIS — Z1389 Encounter for screening for other disorder: Secondary | ICD-10-CM | POA: Diagnosis not present

## 2016-06-27 DIAGNOSIS — R3912 Poor urinary stream: Secondary | ICD-10-CM | POA: Diagnosis not present

## 2016-06-27 DIAGNOSIS — N529 Male erectile dysfunction, unspecified: Secondary | ICD-10-CM | POA: Diagnosis not present

## 2016-06-27 DIAGNOSIS — N401 Enlarged prostate with lower urinary tract symptoms: Secondary | ICD-10-CM | POA: Diagnosis not present

## 2016-06-27 DIAGNOSIS — N486 Induration penis plastica: Secondary | ICD-10-CM | POA: Diagnosis not present

## 2016-07-04 DIAGNOSIS — D090 Carcinoma in situ of bladder: Secondary | ICD-10-CM | POA: Diagnosis not present

## 2016-07-10 ENCOUNTER — Ambulatory Visit (INDEPENDENT_AMBULATORY_CARE_PROVIDER_SITE_OTHER): Payer: BLUE CROSS/BLUE SHIELD | Admitting: Gastroenterology

## 2016-07-10 ENCOUNTER — Encounter: Payer: Self-pay | Admitting: Gastroenterology

## 2016-07-10 DIAGNOSIS — R1314 Dysphagia, pharyngoesophageal phase: Secondary | ICD-10-CM

## 2016-07-10 DIAGNOSIS — K219 Gastro-esophageal reflux disease without esophagitis: Secondary | ICD-10-CM

## 2016-07-10 MED ORDER — OMEPRAZOLE 20 MG PO CPDR: DELAYED_RELEASE_CAPSULE | ORAL | 3 refills | Status: DC

## 2016-07-10 NOTE — Progress Notes (Signed)
ON RECALL  °

## 2016-07-10 NOTE — Patient Instructions (Signed)
DRINK WATER TO KEEP YOUR URINE LIGHT YELLOW.  CONTINUE YOUR WEIGHT LOSS EFFORTS.  AVOID REFLUX TRIGGERS. SEE INFO BELOW.   CONTINUE OMEPRAZOLE.  TAKE 30 MINUTES PRIOR TO YOUR MEALS TWICE DAILY.  FOLLOW UP IN 6 MOS.     Lifestyle and home remedies TO CONTROL HEARTBURN/REFLUX You may eliminate or reduce the frequency of heartburn by making the following lifestyle changes:  . Control your weight. Being overweight is a major risk factor for heartburn and GERD. Excess pounds put pressure on your abdomen, pushing up your stomach and causing acid to back up into your esophagus.   . Eat smaller meals. 4 TO 6 MEALS A DAY. This reduces pressure on the lower esophageal sphincter, helping to prevent the valve from opening and acid from washing back into your esophagus.   Dolphus Jenny your belt. Clothes that fit tightly around your waist put pressure on your abdomen and the lower esophageal sphincter.    . Eliminate heartburn triggers. Everyone has specific triggers. Common triggers such as fatty or fried foods, spicy food, tomato sauce, carbonated beverages, alcohol, chocolate, mint, garlic, onion, caffeine and nicotine may make heartburn worse.   Marland Kitchen Avoid stooping or bending. Tying your shoes is OK. Bending over for longer periods to weed your garden isn't, especially soon after eating.   . Don't lie down after a meal. Wait at least three to four hours after eating before going to bed, and don't lie down right after eating.   Alternative medicine . Several home remedies exist for treating GERD, but they provide only temporary relief. They include drinking baking soda (sodium bicarbonate) added to water or drinking other fluids such as baking soda mixed with cream of tartar and water. . Although these liquids create temporary relief by neutralizing, washing away or buffering acids, eventually they aggravate the situation by adding gas and fluid to your stomach, increasing pressure and causing more acid  reflux. Further, adding more sodium to your diet may increase your blood pressure and add stress to your heart, and excessive bicarbonate ingestion can alter the acid-base balance in your body.

## 2016-07-10 NOTE — Progress Notes (Signed)
   Subjective:    Patient ID: Douglas Sims, male    DOB: 1955-09-10, 61 y.o.   MRN: [id number] Glo Herring., MD  HPI Wife left to go to [facility], [place]. MET HIS WIFE IN [place]. 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 HAVING TROUBLE WITH BLOOD FLOW AND NOW NEEDS A PENILE IMPLANT.  PT DENIES FEVER, CHILLS, HEMATOCHEZIA, HEMATEMESIS, nausea, vomiting, melena, diarrhea, CHEST PAIN, SHORTNESS OF BREATH,  CHANGE IN BOWEL IN HABITS, constipation, abdominal pain, problems swallowing, problems with sedation, heartburn or indigestion.  Past Medical History:  Diagnosis Date  . GERD (gastroesophageal reflux disease)   . Hypertension   . Wears glasses    Past Surgical History:  Procedure Laterality Date  . COLONOSCOPY    . COLONOSCOPY N/A 01/16/2016   Procedure: COLONOSCOPY;  Surgeon: Danie Binder, MD;  Location: [facility];  Service: Endoscopy;  Laterality: N/A;  230  . FASCIECTOMY Left 06/07/2014   Procedure: LEFT PALM FASCIECTOMY LEFT RING FINGER;  Surgeon: Daryll Brod, MD;  Location: [facility];  Service: Orthopedics;  Laterality: Left;  . PLANTAR FASCIA SURGERY  2007   left foot  . TONSILLECTOMY    . UPPER GI ENDOSCOPY     No Known Allergies  Current Outpatient Prescriptions  Medication Sig Dispense Refill  . ALPRAZolam (XANAX) 0.5 MG tablet Take 0.5 mg by mouth daily.     Marland Kitchen aspirin EC 81 MG tablet Take 81 mg by mouth daily.     Marland Kitchen linaclotide 145 MCG CAPS capsule 145 mcg by mouth daily.     . meloxicam (MOBIC) 7.5 MG tablet Take 7.5 mg by mouth daily.     Marland Kitchen omeprazole (PRILOSEC) 20 MG capsule TAKE 30  MINS  BEFORE  BREAKFAST  And DINNER FOR  ACID  REFLUX    . tamsulosin 0.4 MG CAPS capsule Take 0.4 mg by mouth daily.     .      Clarnce Flock Palmetto, Serenoa repens, 160 MG TABS Take by mouth.     Review of Systems PER HPI OTHERWISE ALL SYSTEMS ARE NEGATIVE.    Objective:   Physical Exam    Constitutional: He is oriented to person, place, and time. He appears well-developed and well-nourished. No distress.  HENT:  Head: Normocephalic and atraumatic.  Mouth/Throat: Oropharynx is clear and moist. No oropharyngeal exudate.  Eyes: Pupils are equal, round, and reactive to light. No scleral icterus.  Neck: Normal range of motion. Neck supple.  Cardiovascular: Normal rate, regular rhythm and normal heart sounds.   Pulmonary/Chest: Effort normal and breath sounds normal. No respiratory distress.  Abdominal: Soft. Bowel sounds are normal. He exhibits no distension. There is no tenderness.  Musculoskeletal: He exhibits no edema.  Lymphadenopathy:    He has no cervical adenopathy.  Neurological: He is alert and oriented to person, place, and time.  Psychiatric: He has a normal mood and affect.  Vitals reviewed.     Assessment & Plan:

## 2016-07-10 NOTE — Assessment & Plan Note (Signed)
SYMPTOMS FAIRLY WELL CONTROLLED.  DRINK WATER TO KEEP YOUR URINE LIGHT YELLOW. CONTINUE YOUR WEIGHT LOSS EFFORTS. AVOID REFLUX TRIGGERS.  HANDOUT GIVEN. CONTINUE OMEPRAZOLE 30 MINS PRIOR TO YOUR MEALS TWICE DAILY. FOLLOW UP IN 6 MOS.

## 2016-07-10 NOTE — Progress Notes (Signed)
cc'ed to pcp °

## 2016-07-10 NOTE — Assessment & Plan Note (Signed)
SYMPTOMS CONTROLLED/RESOLVED.  CONTINUE TO MONITOR SYMPTOMS. 

## 2016-08-16 DIAGNOSIS — C679 Malignant neoplasm of bladder, unspecified: Secondary | ICD-10-CM | POA: Diagnosis not present

## 2016-08-16 DIAGNOSIS — F419 Anxiety disorder, unspecified: Secondary | ICD-10-CM | POA: Diagnosis not present

## 2016-08-16 DIAGNOSIS — Z1389 Encounter for screening for other disorder: Secondary | ICD-10-CM | POA: Diagnosis not present

## 2016-08-16 DIAGNOSIS — Z6827 Body mass index (BMI) 27.0-27.9, adult: Secondary | ICD-10-CM | POA: Diagnosis not present

## 2016-08-16 DIAGNOSIS — E663 Overweight: Secondary | ICD-10-CM | POA: Diagnosis not present

## 2016-08-16 DIAGNOSIS — N401 Enlarged prostate with lower urinary tract symptoms: Secondary | ICD-10-CM | POA: Diagnosis not present

## 2016-09-09 DIAGNOSIS — Z8551 Personal history of malignant neoplasm of bladder: Secondary | ICD-10-CM | POA: Diagnosis not present

## 2016-09-09 DIAGNOSIS — F419 Anxiety disorder, unspecified: Secondary | ICD-10-CM | POA: Diagnosis not present

## 2016-09-09 DIAGNOSIS — Z87891 Personal history of nicotine dependence: Secondary | ICD-10-CM | POA: Diagnosis not present

## 2016-09-09 DIAGNOSIS — N486 Induration penis plastica: Secondary | ICD-10-CM | POA: Diagnosis not present

## 2016-09-09 DIAGNOSIS — N529 Male erectile dysfunction, unspecified: Secondary | ICD-10-CM | POA: Diagnosis not present

## 2016-09-09 DIAGNOSIS — N522 Drug-induced erectile dysfunction: Secondary | ICD-10-CM | POA: Diagnosis not present

## 2016-09-09 DIAGNOSIS — N400 Enlarged prostate without lower urinary tract symptoms: Secondary | ICD-10-CM | POA: Diagnosis not present

## 2016-09-09 DIAGNOSIS — K219 Gastro-esophageal reflux disease without esophagitis: Secondary | ICD-10-CM | POA: Diagnosis not present

## 2016-09-19 DIAGNOSIS — N486 Induration penis plastica: Secondary | ICD-10-CM | POA: Diagnosis not present

## 2016-11-22 DIAGNOSIS — C672 Malignant neoplasm of lateral wall of bladder: Secondary | ICD-10-CM | POA: Diagnosis not present

## 2016-11-22 DIAGNOSIS — N400 Enlarged prostate without lower urinary tract symptoms: Secondary | ICD-10-CM | POA: Diagnosis not present

## 2016-11-22 DIAGNOSIS — Z8551 Personal history of malignant neoplasm of bladder: Secondary | ICD-10-CM | POA: Diagnosis not present

## 2016-11-22 DIAGNOSIS — N486 Induration penis plastica: Secondary | ICD-10-CM | POA: Diagnosis not present

## 2016-11-22 DIAGNOSIS — D090 Carcinoma in situ of bladder: Secondary | ICD-10-CM | POA: Diagnosis not present

## 2016-12-04 ENCOUNTER — Encounter: Payer: Self-pay | Admitting: Gastroenterology

## 2016-12-12 DIAGNOSIS — K080 Exfoliation of teeth due to systemic causes: Secondary | ICD-10-CM | POA: Diagnosis not present

## 2016-12-31 DIAGNOSIS — Z6828 Body mass index (BMI) 28.0-28.9, adult: Secondary | ICD-10-CM | POA: Diagnosis not present

## 2016-12-31 DIAGNOSIS — Z1389 Encounter for screening for other disorder: Secondary | ICD-10-CM | POA: Diagnosis not present

## 2016-12-31 DIAGNOSIS — L299 Pruritus, unspecified: Secondary | ICD-10-CM | POA: Diagnosis not present

## 2016-12-31 DIAGNOSIS — L255 Unspecified contact dermatitis due to plants, except food: Secondary | ICD-10-CM | POA: Diagnosis not present

## 2016-12-31 DIAGNOSIS — E663 Overweight: Secondary | ICD-10-CM | POA: Diagnosis not present

## 2017-01-15 DIAGNOSIS — N400 Enlarged prostate without lower urinary tract symptoms: Secondary | ICD-10-CM | POA: Diagnosis not present

## 2017-01-15 DIAGNOSIS — F419 Anxiety disorder, unspecified: Secondary | ICD-10-CM | POA: Diagnosis not present

## 2017-01-15 DIAGNOSIS — M1991 Primary osteoarthritis, unspecified site: Secondary | ICD-10-CM | POA: Diagnosis not present

## 2017-01-15 DIAGNOSIS — N529 Male erectile dysfunction, unspecified: Secondary | ICD-10-CM | POA: Diagnosis not present

## 2017-01-15 DIAGNOSIS — N486 Induration penis plastica: Secondary | ICD-10-CM | POA: Diagnosis not present

## 2017-01-15 DIAGNOSIS — Z1389 Encounter for screening for other disorder: Secondary | ICD-10-CM | POA: Diagnosis not present

## 2017-01-15 DIAGNOSIS — Z6828 Body mass index (BMI) 28.0-28.9, adult: Secondary | ICD-10-CM | POA: Diagnosis not present

## 2017-01-15 DIAGNOSIS — R635 Abnormal weight gain: Secondary | ICD-10-CM | POA: Diagnosis not present

## 2017-07-16 DIAGNOSIS — Z6828 Body mass index (BMI) 28.0-28.9, adult: Secondary | ICD-10-CM | POA: Diagnosis not present

## 2017-07-16 DIAGNOSIS — Z1389 Encounter for screening for other disorder: Secondary | ICD-10-CM | POA: Diagnosis not present

## 2017-07-16 DIAGNOSIS — N400 Enlarged prostate without lower urinary tract symptoms: Secondary | ICD-10-CM | POA: Diagnosis not present

## 2017-07-16 DIAGNOSIS — M1990 Unspecified osteoarthritis, unspecified site: Secondary | ICD-10-CM | POA: Diagnosis not present

## 2017-07-16 DIAGNOSIS — K219 Gastro-esophageal reflux disease without esophagitis: Secondary | ICD-10-CM | POA: Diagnosis not present

## 2017-08-12 DIAGNOSIS — Z1389 Encounter for screening for other disorder: Secondary | ICD-10-CM | POA: Diagnosis not present

## 2017-08-12 DIAGNOSIS — R7309 Other abnormal glucose: Secondary | ICD-10-CM | POA: Diagnosis not present

## 2017-08-12 DIAGNOSIS — E663 Overweight: Secondary | ICD-10-CM | POA: Diagnosis not present

## 2017-08-12 DIAGNOSIS — Z6829 Body mass index (BMI) 29.0-29.9, adult: Secondary | ICD-10-CM | POA: Diagnosis not present

## 2017-08-12 DIAGNOSIS — Z0000 Encounter for general adult medical examination without abnormal findings: Secondary | ICD-10-CM | POA: Diagnosis not present

## 2017-08-21 DIAGNOSIS — Z8551 Personal history of malignant neoplasm of bladder: Secondary | ICD-10-CM | POA: Diagnosis not present

## 2017-08-21 DIAGNOSIS — N401 Enlarged prostate with lower urinary tract symptoms: Secondary | ICD-10-CM | POA: Diagnosis not present

## 2017-08-21 DIAGNOSIS — N5201 Erectile dysfunction due to arterial insufficiency: Secondary | ICD-10-CM | POA: Diagnosis not present

## 2017-08-21 DIAGNOSIS — D090 Carcinoma in situ of bladder: Secondary | ICD-10-CM | POA: Diagnosis not present

## 2017-08-21 DIAGNOSIS — C672 Malignant neoplasm of lateral wall of bladder: Secondary | ICD-10-CM | POA: Diagnosis not present

## 2017-08-21 DIAGNOSIS — R3912 Poor urinary stream: Secondary | ICD-10-CM | POA: Diagnosis not present

## 2017-10-23 DIAGNOSIS — Z23 Encounter for immunization: Secondary | ICD-10-CM | POA: Diagnosis not present

## 2017-12-07 ENCOUNTER — Other Ambulatory Visit: Payer: Self-pay | Admitting: Gastroenterology

## 2018-02-03 DIAGNOSIS — E6609 Other obesity due to excess calories: Secondary | ICD-10-CM | POA: Diagnosis not present

## 2018-02-03 DIAGNOSIS — F419 Anxiety disorder, unspecified: Secondary | ICD-10-CM | POA: Diagnosis not present

## 2018-02-03 DIAGNOSIS — Z1389 Encounter for screening for other disorder: Secondary | ICD-10-CM | POA: Diagnosis not present

## 2018-02-03 DIAGNOSIS — Z6830 Body mass index (BMI) 30.0-30.9, adult: Secondary | ICD-10-CM | POA: Diagnosis not present

## 2018-04-09 DIAGNOSIS — J22 Unspecified acute lower respiratory infection: Secondary | ICD-10-CM | POA: Diagnosis not present

## 2018-04-09 DIAGNOSIS — E663 Overweight: Secondary | ICD-10-CM | POA: Diagnosis not present

## 2018-04-09 DIAGNOSIS — J209 Acute bronchitis, unspecified: Secondary | ICD-10-CM | POA: Diagnosis not present

## 2018-04-09 DIAGNOSIS — Z6828 Body mass index (BMI) 28.0-28.9, adult: Secondary | ICD-10-CM | POA: Diagnosis not present

## 2018-06-26 DIAGNOSIS — F419 Anxiety disorder, unspecified: Secondary | ICD-10-CM | POA: Diagnosis not present

## 2018-06-26 DIAGNOSIS — Z681 Body mass index (BMI) 19 or less, adult: Secondary | ICD-10-CM | POA: Diagnosis not present

## 2018-06-26 DIAGNOSIS — Z1389 Encounter for screening for other disorder: Secondary | ICD-10-CM | POA: Diagnosis not present

## 2019-08-18 ENCOUNTER — Encounter: Payer: Self-pay | Admitting: Urology

## 2019-08-18 ENCOUNTER — Ambulatory Visit (INDEPENDENT_AMBULATORY_CARE_PROVIDER_SITE_OTHER): Payer: Medicare Other | Admitting: Urology

## 2019-08-18 ENCOUNTER — Other Ambulatory Visit: Payer: Self-pay

## 2019-08-18 ENCOUNTER — Other Ambulatory Visit: Payer: Self-pay | Admitting: Urology

## 2019-08-18 ENCOUNTER — Other Ambulatory Visit (HOSPITAL_COMMUNITY): Payer: Self-pay | Admitting: Urology

## 2019-08-18 ENCOUNTER — Telehealth: Payer: Self-pay | Admitting: Urology

## 2019-08-18 VITALS — BP 145/94 | HR 74 | Temp 97.3°F | Ht 72.0 in | Wt 220.0 lb

## 2019-08-18 DIAGNOSIS — C678 Malignant neoplasm of overlapping sites of bladder: Secondary | ICD-10-CM | POA: Diagnosis not present

## 2019-08-18 DIAGNOSIS — R972 Elevated prostate specific antigen [PSA]: Secondary | ICD-10-CM | POA: Insufficient documentation

## 2019-08-18 LAB — POCT URINALYSIS DIPSTICK
Bilirubin, UA: NEGATIVE
Blood, UA: NEGATIVE
Glucose, UA: NEGATIVE
Ketones, UA: NEGATIVE
Leukocytes, UA: NEGATIVE
Nitrite, UA: NEGATIVE
Protein, UA: POSITIVE — AB
Spec Grav, UA: 1.015 (ref 1.010–1.025)
Urobilinogen, UA: 0.2 E.U./dL
pH, UA: 6 (ref 5.0–8.0)

## 2019-08-18 MED ORDER — LEVOFLOXACIN 750 MG PO TABS: 750.0000 mg | ORAL_TABLET | Freq: Every day | ORAL | 0 refills | Status: DC

## 2019-08-18 NOTE — Progress Notes (Signed)
Urological Symptom Review  Patient is experiencing the following symptoms: Frequent urination Get up at night to urinate   Review of Systems  Gastrointestinal (upper)  : Indigestion/heartburn  Gastrointestinal (lower) : Negative for lower GI symptoms  Constitutional : Negative for symptoms  Skin: Negative for skin symptoms  Eyes: Blurred vision  Ear/Nose/Throat : Negative for Ear/Nose/Throat symptoms  Hematologic/Lymphatic: Negative for Hematologic/Lymphatic symptoms  Cardiovascular : Negative for cardiovascular symptoms  Respiratory : Negative for respiratory symptoms  Endocrine: Negative for endocrine symptoms  Musculoskeletal: Joint pain  Neurological: Negative for neurological symptoms  Psychologic: Negative for psychiatric symptoms

## 2019-08-18 NOTE — Patient Instructions (Addendum)
Patient Name: Douglas Sims Appointment Time  12:15 Appointment Date: May 5th  Location:  [facility]  Prostate Biopsy Instructions  Stop all aspirin or blood thinners (aspirin, plavix, coumadin, warfarin, motrin, ibuprofen, advil, aleve, naproxen, naprosyn) for 7 days prior to the procedure.  If you have any questions about stopping these medications, please contact your primary care physician or cardiologist.  Having a light meal prior to the procedure is recommended.  If you are diabetic or have low blood sugar please bring a small snack or glucose tablet.  A Fleets enema is needed to be purchased over the counter at a local pharmacy and used 2 hours before you scheduled appointment.  This can be purchased over the counter at any pharmacy.  Antibiotics will be administered in the clinic at the time of the procedure and 1 tablet has been sent to your pharmacy. Please take the antibiotic as prescribed.    Please bring someone with you to the procedure to drive you home.  A follow up appointment has been scheduled for you to receive the results of the biopsy.  If you have any questions or concerns, please feel free to call the office at [phone] or send a Mychart message.    Thank you, [facility]   Transrectal Ultrasound-Guided Prostate Biopsy, Care After This sheet gives you information about how to care for yourself after your procedure. Your health care provider may also give you more specific instructions. If you have problems or questions, contact your health care provider. What can I expect after the procedure? After the procedure, it is common to have:  Pain and discomfort near your rectum, especially while sitting.  Pink-colored urine due to small amounts of blood in your urine.  A burning feeling while urinating.  Blood in your stool (feces) or bleeding from your rectum.  Blood in your semen. Follow these instructions at  home: Medicines  Take over-the-counter and prescription medicines only as told by your health care provider.  If you were prescribed antibiotic medicine, take it as told by your health care provider. Do not stop taking the antibiotic even if you start to feel better. Activity  Do not drive for 24 hours if you were given a medicine to help you relax (sedative) during your procedure.  Return to your normal activities as told by your health care provider. Ask your health care provider what activities are safe for you.  Ask your health care provider when it is okay for you to resume sexual activity.  Do not lift anything that is heavier than 10 lb (4.5 kg), or the limit that you are told, until your health care provider says that it is safe. General instructions   Drink enough water to keep your urine pale yellow.  Watch your urine, stool, and semen for new or increased bleeding.  Keep all follow-up visits as told by your health care provider. This is important. Contact a health care provider if:  You have any of the following: ? Blood clots in your urine or stool. ? Blood in your urine more than 2 weeks after the procedure. ? Blood in your semen more than 2 months after the procedure. ? New or increased bleeding in your urine, stool, or semen. ? Severe pain in your abdomen.  Your urine smells bad or unusual.  You have trouble urinating.  Your lower abdomen feels firm.  You have problems getting an erection.  You have nausea or you  vomit. Get help right away if:  You have a fever or chills. This could be a sign of infection.  You have bright red urine.  You have severe pain that does not get better with medicine.  You cannot urinate. Summary  After this procedure, it is common to have pain and discomfort around your rectum, especially while sitting.  You may have blood in your urine and stool after the procedure.  It is common to have blood in your semen for 1-2  months after this procedure.  If you were prescribed antibiotic medicine, take it as told by your health care provider. Do not stop taking the antibiotic even if you start to feel better.  Get help right away if you have a fever or chills. This could be a sign of infection. This information is not intended to replace advice given to you by your health care provider. Make sure you discuss any questions you have with your health care provider. Document Revised: 08/05/2018 Document Reviewed: 10/07/2016 Elsevier Patient Education  [place].

## 2019-08-18 NOTE — Telephone Encounter (Signed)
Did you mean to send him a suppository?

## 2019-08-18 NOTE — Progress Notes (Signed)
08/18/2019 10:33 AM   Douglas Sims 12/23/1955 [id number]  Referring provider: Redmond School, MD [address] [place],  [place] [address]  Elevated PSA and hx of bladder cancer  HPI: Douglas Sims is a 64yo here for evaluation of elevated PSa and bladder cancer. NO worsening LUTS. No dysuria.  PSA was 8.4 this year. No previous PSA records that the patient can recall. No family hx of prostate cancer. He has a hx of CIS and was treated at [facility]  His records  from [facility] are as follows: He had carcinoma in situ of the bladder. He underwent induction BCG and his last treatment was 12/01/2015.He is on tamsulosin and Saw Palmetto. He underwent a CT scan of the abdomen and pelvis on 01/02/2016. There were indeterminate lesions in the gallbladder and liver and he will probably need to see GI about that and he had 4-mm nodules that were very nonspecific in the left lower lobe and the right lower lobe of the lungs. He has gotten consult by Dr. Oneida Alar who performed an MRI of his abdomen and said that the gallbladder and liver lesions are not of great significance. He is going to follow up on the tiny lung nodules. He subsequently underwent followup biopsy on 01/23/2016 that was okay. He had a plaque excision graft of his penis for Peyronie's disease on 03/28/2016 and an IPP on 01/22/17 by Dr. Odis Luster. His last office cystoscopy was OK on 03/13/17. He comes in for cystoscopy.    PMH: Past Medical History:  Diagnosis Date  . Arthritis   . Bladder cancer ([place])   . GERD (gastroesophageal reflux disease)   . Hypertension   . Wears glasses     Surgical History: Past Surgical History:  Procedure Laterality Date  . BLADDER SURGERY    . COLONOSCOPY    . COLONOSCOPY N/A 01/16/2016   Procedure: COLONOSCOPY;  Surgeon: Danie Binder, MD;  [facility];  Service: Endoscopy;  Laterality: N/A;  230  . FASCIECTOMY Left 06/07/2014   Procedure: LEFT PALM FASCIECTOMY LEFT RING  FINGER;  Surgeon: Daryll Brod, MD;  Location: [place];  Service: Orthopedics;  Laterality: Left;  . PLANTAR FASCIA SURGERY  2007   left foot  . TONSILLECTOMY    . UPPER GI ENDOSCOPY      Home Medications:  Allergies as of 08/18/2019      Reactions   Codeine Itching      Medication List       Accurate as of August 18, 2019 10:33 AM. If you have any questions, ask your nurse or doctor.        ALPRAZolam 0.5 MG tablet Commonly known as: XANAX Take 0.5 mg by mouth daily.   ALPRAZolam 1 MG tablet Commonly known as: XANAX   aspirin EC 81 MG tablet Take 81 mg by mouth daily.   aspirin 81 MG EC tablet Take by mouth.   HYDROcodone-acetaminophen 5-325 MG tablet Commonly known as: Norco Take 1 tablet by mouth every 6 (six) hours as needed for moderate pain.   linaclotide 145 MCG Caps capsule Commonly known as: Linzess 1 PO with your first meal What changed:   how much to take  how to take this  when to take this   meloxicam 7.5 MG tablet Commonly known as: MOBIC Take 7.5 mg by mouth daily.   omeprazole 20 MG capsule Commonly known as: PRILOSEC Take by mouth.   omeprazole 20 MG capsule Commonly known as:  PRILOSEC TAKE 1 CAPSULE BY MOUTH TWICE DAILY 30  MINUTES BEFORE BREAKFAST AND DINNER FOR ACID  REFLUX.   Saw Palmetto (Serenoa repens) 160 MG Tabs Take by mouth.   tamsulosin 0.4 MG Caps capsule Commonly known as: FLOMAX Take 0.4 mg by mouth daily.       Allergies:  Allergies  Allergen Reactions  . Codeine Itching    Family History: Family History  Problem Relation Age of Onset  . Lung cancer Father     Social History:  reports that he quit smoking about 26 years ago. He has never used smokeless tobacco. He reports that he does not drink alcohol or use drugs.  ROS: All other review of systems were reviewed and are negative except what is noted above in HPI  Physical Exam: BP (!) 145/94   Pulse 74   Temp (!) 97.3 F (36.3 C)    Ht 6' (1.829 m)   Wt 220 lb (99.8 kg)   BMI 29.84 kg/m   Constitutional:  Alert and oriented, No acute distress. HEENT: [place] AT, moist mucus membranes.  Trachea midline, no masses. Cardiovascular: No clubbing, cyanosis, or edema. Respiratory: Normal respiratory effort, no increased work of breathing. GI: Abdomen is soft, nontender, nondistended, no abdominal masses GU: No CVA tenderness. Circumcised phallus. No masses/lesions on penis, testis, scrotum. Prostate 40g smooth no nodules no induration.  Lymph: No cervical or inguinal lymphadenopathy. Skin: No rashes, bruises or suspicious lesions. Neurologic: Grossly intact, no focal deficits, moving all 4 extremities. Psychiatric: Normal mood and affect.  Laboratory Data: Lab Results  Component Value Date   HGB 14.9 06/07/2014    Lab Results  Component Value Date   CREATININE 0.82 01/09/2016    No results found for: PSA  No results found for: TESTOSTERONE  No results found for: HGBA1C  Urinalysis    Component Value Date/Time   BILIRUBINUR neg 08/18/2019 0959   PROTEINUR Positive (A) 08/18/2019 0959   UROBILINOGEN 0.2 08/18/2019 0959   NITRITE neg 08/18/2019 0959   LEUKOCYTESUR Negative 08/18/2019 0959    No results found for: LABMICR, [place], RBCUA, LABEPIT, MUCUS, BACTERIA  Pertinent Imaging:  No results found for this or any previous visit. No results found for this or any previous visit. No results found for this or any previous visit. No results found for this or any previous visit. No results found for this or any previous visit. No results found for this or any previous visit. No results found for this or any previous visit. No results found for this or any previous visit.  Assessment & Plan:    1. Elevated PSA -Schedule for prostate biopsy. Risks/benefits/alternatives discussed. rx for levaquin given  - POCT urinalysis dipstick  2. Malignant neoplasm of overlapping sites of bladder [facility]) -we will  perform cystoscopy after prostate biopsy   No follow-ups on file.  Nicolette Bang, MD  [facility] [place]

## 2019-08-18 NOTE — Telephone Encounter (Signed)
Pt called and stated that only one medication was sent in to the pharmacy.

## 2019-08-18 NOTE — Telephone Encounter (Signed)
Left message to return call 

## 2019-09-01 ENCOUNTER — Other Ambulatory Visit: Payer: Self-pay

## 2019-09-01 ENCOUNTER — Ambulatory Visit (INDEPENDENT_AMBULATORY_CARE_PROVIDER_SITE_OTHER): Payer: Medicare Other | Admitting: Urology

## 2019-09-01 ENCOUNTER — Ambulatory Visit (HOSPITAL_COMMUNITY)
Admission: RE | Admit: 2019-09-01 | Discharge: 2019-09-01 | Disposition: A | Discharge status: Home / Self Care | Payer: Medicare Other | Source: Ambulatory Visit | Attending: Urology | Admitting: Urology

## 2019-09-01 ENCOUNTER — Other Ambulatory Visit: Payer: Self-pay | Admitting: Urology

## 2019-09-01 DIAGNOSIS — R972 Elevated prostate specific antigen [PSA]: Secondary | ICD-10-CM | POA: Diagnosis not present

## 2019-09-01 DIAGNOSIS — C61 Malignant neoplasm of prostate: Secondary | ICD-10-CM | POA: Insufficient documentation

## 2019-09-01 MED ORDER — LIDOCAINE HCL (PF) 2 % IJ SOLN
INTRAMUSCULAR | Status: AC
  Administered 2019-09-01: 12:00:00 10 mL
  Filled 2019-09-01: qty 10

## 2019-09-01 MED ORDER — GENTAMICIN SULFATE 40 MG/ML IJ SOLN
INTRAMUSCULAR | Status: AC
  Administered 2019-09-01: 80 mg via INTRAMUSCULAR
  Filled 2019-09-01: qty 2

## 2019-09-01 MED ORDER — GENTAMICIN SULFATE 40 MG/ML IJ SOLN: 80.0000 mg | Freq: Once | INTRAMUSCULAR | Status: AC

## 2019-09-06 NOTE — Progress Notes (Signed)
Prostate Biopsy Procedure   Informed consent was obtained after discussing risks/benefits of the procedure.  A time out was performed to ensure correct patient identity.  Pre-Procedure: - Last PSA Level: No results found for: PSA - Gentamicin given prophylactically - Levaquin 500 mg administered PO -Transrectal Ultrasound performed revealing a 34 gm prostate -No significant hypoechoic or median lobe noted  Procedure: - Prostate block performed using 10 cc 1% lidocaine and biopsies taken from sextant areas, a total of 12 under ultrasound guidance.  Post-Procedure: - Patient tolerated the procedure well - He was counseled to seek immediate medical attention if experiences any severe pain, significant bleeding, or fevers - Return in one week to discuss biopsy results  

## 2019-09-08 ENCOUNTER — Telehealth: Payer: Self-pay | Admitting: Urology

## 2019-09-08 NOTE — Telephone Encounter (Signed)
Pt scheduled for 6/2 for biopsy f/u. Path is back ( positive) pt calling asking about results.

## 2019-09-08 NOTE — Telephone Encounter (Signed)
Patient calling for prostate biopsy results.

## 2019-09-09 NOTE — Telephone Encounter (Signed)
Pt called back this morning inquiring about results. I told him the note is in for the Dr and we will get back with him as soon as the Dr responds.

## 2019-09-16 NOTE — Telephone Encounter (Signed)
He needs to see e in office

## 2019-09-17 NOTE — Telephone Encounter (Signed)
I spoke with Dr. Alyson Ingles who instructed me to give pt that pathology report of being "positive" and for pt to keep scheduled appt for prostate cancer talk on June 2nd. Pt voiced understanding and will keep scheduled appt.

## 2019-09-29 ENCOUNTER — Other Ambulatory Visit: Payer: Self-pay

## 2019-09-29 ENCOUNTER — Ambulatory Visit: Payer: Medicare Other | Admitting: Urology

## 2019-09-29 ENCOUNTER — Ambulatory Visit (INDEPENDENT_AMBULATORY_CARE_PROVIDER_SITE_OTHER): Payer: Medicare Other | Admitting: Urology

## 2019-09-29 VITALS — BP 133/79 | HR 69 | Temp 97.5°F | Ht 73.0 in | Wt 225.0 lb

## 2019-09-29 DIAGNOSIS — C61 Malignant neoplasm of prostate: Secondary | ICD-10-CM | POA: Insufficient documentation

## 2019-09-29 DIAGNOSIS — N2889 Other specified disorders of kidney and ureter: Secondary | ICD-10-CM

## 2019-09-29 LAB — POCT URINALYSIS DIPSTICK
Bilirubin, UA: NEGATIVE
Blood, UA: NEGATIVE
Glucose, UA: NEGATIVE
Ketones, UA: NEGATIVE
Leukocytes, UA: NEGATIVE
Nitrite, UA: NEGATIVE
Protein, UA: NEGATIVE
Spec Grav, UA: 1.015 (ref 1.010–1.025)
Urobilinogen, UA: 4 E.U./dL — AB
pH, UA: 6.5 (ref 5.0–8.0)

## 2019-09-29 MED ORDER — CIPROFLOXACIN HCL 500 MG PO TABS
500.0000 mg | ORAL_TABLET | Freq: Once | ORAL | Status: AC
Start: 2019-09-29 — End: 2019-09-29
  Administered 2019-09-29: 500 mg via ORAL

## 2019-09-29 NOTE — Progress Notes (Signed)
09/29/2019 4:13 PM   Douglas Sims [birth year] [id number]  Referring provider: Redmond School, MD [address] [place],  [place] [address]  Prostate cancer  HPI: Douglas Sims is a 64yo here for followup after prostate biopsy.  Biopsy revealed Gleason 3+3=6 in 2/12 cores 5-40% of core all cores on right. PSA 8.4. He has moderate ED. NO significant LUTS. He has a hx bladder cancer treated [facility].   PMH: Past Medical History:  Diagnosis Date  . Arthritis   . Bladder cancer ([place])   . GERD (gastroesophageal reflux disease)   . Hypertension   . Wears glasses     Surgical History: Past Surgical History:  Procedure Laterality Date  . BLADDER SURGERY    . COLONOSCOPY    . COLONOSCOPY N/A 01/16/2016   Procedure: COLONOSCOPY;  Surgeon: Danie Binder, MD;  [facility];  Service: Endoscopy;  Laterality: N/A;  230  . FASCIECTOMY Left 06/07/2014   Procedure: LEFT PALM FASCIECTOMY LEFT RING FINGER;  Surgeon: Daryll Brod, MD;  Location: [place];  Service: Orthopedics;  Laterality: Left;  . PLANTAR FASCIA SURGERY  2007   left foot  . TONSILLECTOMY    . UPPER GI ENDOSCOPY      Home Medications:  Allergies as of 09/29/2019      Reactions   Codeine Itching      Medication List       Accurate as of September 29, 2019  4:13 PM. If you have any questions, ask your nurse or doctor.        ALPRAZolam 0.5 MG tablet Commonly known as: XANAX Take 0.5 mg by mouth daily.   ALPRAZolam 1 MG tablet Commonly known as: XANAX   aspirin EC 81 MG tablet Take 81 mg by mouth daily.   aspirin 81 MG EC tablet Take by mouth.   HYDROcodone-acetaminophen 5-325 MG tablet Commonly known as: Norco Take 1 tablet by mouth every 6 (six) hours as needed for moderate pain.   levofloxacin 750 MG tablet Commonly known as: Levaquin Take 1 tablet (750 mg total) by mouth daily.   linaclotide 145 MCG Caps capsule Commonly known as: Linzess 1 PO with your first  meal What changed:   how much to take  how to take this  when to take this   meloxicam 7.5 MG tablet Commonly known as: MOBIC Take 7.5 mg by mouth daily.   omeprazole 20 MG capsule Commonly known as: PRILOSEC Take by mouth.   omeprazole 20 MG capsule Commonly known as: PRILOSEC TAKE 1 CAPSULE BY MOUTH TWICE DAILY 30  MINUTES BEFORE BREAKFAST AND DINNER FOR ACID  REFLUX.   Saw Palmetto (Serenoa repens) 160 MG Tabs Take by mouth.   tamsulosin 0.4 MG Caps capsule Commonly known as: FLOMAX Take 0.4 mg by mouth daily.       Allergies:  Allergies  Allergen Reactions  . Codeine Itching    Family History: Family History  Problem Relation Age of Onset  . Lung cancer Father     Social History:  reports that he quit smoking about 26 years ago. He has never used smokeless tobacco. He reports that he does not drink alcohol or use drugs.  ROS: All other review of systems were reviewed and are negative except what is noted above in HPI  Physical Exam: There were no vitals taken for this visit.  Constitutional:  Alert and oriented, No acute distress. HEENT: Douglas Sims, moist mucus membranes.  Trachea midline,  no masses. Cardiovascular: No clubbing, cyanosis, or edema. Respiratory: Normal respiratory effort, no increased work of breathing. GI: Abdomen is soft, nontender, nondistended, no abdominal masses GU: No CVA tenderness.  Lymph: No cervical or inguinal lymphadenopathy. Skin: No rashes, bruises or suspicious lesions. Neurologic: Grossly intact, no focal deficits, moving all 4 extremities. Psychiatric: Normal mood and affect.  Laboratory Data: Lab Results  Component Value Date   HGB 14.9 06/07/2014    Lab Results  Component Value Date   CREATININE 0.82 01/09/2016    No results found for: PSA  No results found for: TESTOSTERONE  No results found for: HGBA1C  Urinalysis    Component Value Date/Time   BILIRUBINUR neg 08/18/2019 0959   PROTEINUR Positive  (A) 08/18/2019 0959   UROBILINOGEN 0.2 08/18/2019 0959   NITRITE neg 08/18/2019 0959   LEUKOCYTESUR Negative 08/18/2019 0959    No results found for: LABMICR, [place], RBCUA, LABEPIT, MUCUS, BACTERIA  Pertinent Imaging:  No results found for this or any previous visit. No results found for this or any previous visit. No results found for this or any previous visit. No results found for this or any previous visit. No results found for this or any previous visit. No results found for this or any previous visit. No results found for this or any previous visit. No results found for this or any previous visit.    Cystoscopy Procedure Note  Patient identification was confirmed, informed consent was obtained, and patient was prepped using Betadine solution.  Lidocaine jelly was administered per urethral meatus.     Pre-Procedure: - Inspection reveals a normal caliber ureteral meatus.  Procedure: The flexible cystoscope was introduced without difficulty - No urethral strictures/lesions are present. - Enlarged prostate  - Normal bladder neck - Bilateral ureteral orifices identified - Bladder mucosa  reveals no ulcers, tumors, or lesions - No bladder stones - No trabeculation VERY LARGE LEFT URETEROCELE OCCUPYING 30% OF BLADDER  Retroflexion shows no intravesical prostatic protrusion   Post-Procedure: - Patient tolerated the procedure well   Assessment & Plan:    1. Prostate cancer ([place]) -I discussed the natural history of low risk prostate cancer with the patient and the various treatment options including active surveillance, RALP, IMRT, brachytherapy, cryotherapy, HIFU and ADT. After discussing the options the patient is undecided. I will have him meet with Dr. Tresa Moore for consideration of RALP and Dr. Tammi Klippel for consideration of IMRT/ brachytherapy.   2. Ureterocele: He will have a CT hematuria protocol prior due to his large left ureterocele and hx of bladder  cancer   No follow-ups on file.  Nicolette Bang, MD  [facility] [place]

## 2019-09-30 ENCOUNTER — Encounter: Payer: Self-pay | Admitting: Urology

## 2019-09-30 DIAGNOSIS — N2889 Other specified disorders of kidney and ureter: Secondary | ICD-10-CM | POA: Insufficient documentation

## 2019-09-30 NOTE — Patient Instructions (Signed)
Prostate Cancer  The prostate is a male gland that helps make semen. Prostate cancer is when abnormal cells grow in this gland. Follow these instructions at home:  Take over-the-counter and prescription medicines only as told by your doctor.  Eat a healthy diet.  Get plenty of sleep.  Ask your doctor for help to find a support group for men with prostate cancer.  Keep all follow-up visits as told by your doctor. This is important.  If you have to go to the hospital, let your cancer doctor (oncologist) know.  Touch, hold, hug, and caress your partner to continue to show sexual feelings. Contact a doctor if:  You have trouble peeing (urinating).  You have blood in your pee (urine).  You have pain in your hips, back, or chest. Get help right away if:  You have weakness in your legs.  You lose feeling (have numbness) in your legs.  You cannot control your pee or your poop (stool).  You have trouble breathing.  You have sudden pain in your chest.  You have chills or a fever. Summary  The prostate is a male gland that helps make semen. Prostate cancer is when abnormal cells grow in this gland.  Ask your doctor for help to find a support group for men with prostate cancer.  Contact a doctor if you have problems peeing or have any new pain that you did not have before. This information is not intended to replace advice given to you by your health care provider. Make sure you discuss any questions you have with your health care provider. Document Revised: 03/28/2017 Document Reviewed: 12/25/2015 Elsevier Patient Education  2020 Elsevier Inc.  

## 2019-10-04 ENCOUNTER — Encounter: Payer: Self-pay | Admitting: Radiation Oncology

## 2019-10-04 NOTE — Progress Notes (Signed)
See progress note under physician encounter. 

## 2019-10-04 NOTE — Progress Notes (Signed)
GU Location of Tumor / Histology: prostatic adenocarcinoma  If Prostate Cancer, Gleason Score is (3 + 3) and PSA is (8.4). Prostate volume: 34.8  Douglas Sims was referred by Dr. Redmond School to Dr. Alyson Ingles for further evaluation ...  Biopsies of prostate (if applicable) revealed:   Past/Anticipated interventions by urology, if any: prostate biopsy, referral to Dr. Tresa Moore to discuss RALP, referral to Dr. Tammi Klippel to discuss IMRT vs. brachytherapy  Past/Anticipated interventions by medical oncology, if any: hx of bladder ca tx at [facility]  Weight changes, if any: losing weight no appetite  Bowel/Bladder complaints, if any: none  Nausea/Vomiting, if any: none  Pain issues, if any:  none  SAFETY ISSUES:  Prior radiation? none  Pacemaker/ICD? none  Possible current pregnancy? no, male patient  Is the patient on methotrexate? no  Current Complaints / other details:  64 year old male. Stopped smoking in 1995. Divorced recently has a history of bladder cancer with chemotherapy and surgery. He is retired on disability.

## 2019-10-05 ENCOUNTER — Ambulatory Visit
Admission: RE | Admit: 2019-10-05 | Discharge: 2019-10-05 | Disposition: A | Discharge status: Home / Self Care | Payer: Medicare Other | Source: Ambulatory Visit | Attending: Radiation Oncology | Admitting: Radiation Oncology

## 2019-10-05 ENCOUNTER — Encounter: Payer: Self-pay | Admitting: Radiation Oncology

## 2019-10-05 DIAGNOSIS — C61 Malignant neoplasm of prostate: Secondary | ICD-10-CM

## 2019-10-05 DIAGNOSIS — C672 Malignant neoplasm of lateral wall of bladder: Secondary | ICD-10-CM | POA: Diagnosis present

## 2019-10-05 HISTORY — DX: Malignant neoplasm of prostate: C61

## 2019-10-05 NOTE — Patient Instructions (Signed)
Coronavirus (COVID-19) Are you at risk?  Are you at risk for the Coronavirus (COVID-19)?  To be considered HIGH RISK for Coronavirus (COVID-19), you have to meet the following criteria:  . Traveled to [place], [place], [place], [place] or [place]; or in the [place] to [place], [place], [place], or [place]; and have fever, cough, and shortness of breath within the last 2 weeks of travel OR . Been in close contact with a person diagnosed with COVID-19 within the last 2 weeks and have fever, cough, and shortness of breath . IF YOU DO NOT MEET THESE CRITERIA, YOU ARE CONSIDERED LOW RISK FOR COVID-19.  What to do if you are HIGH RISK for COVID-19?  . If you are having a medical emergency, call 911. . Seek medical care right away. Before you go to a doctor's office, urgent care or emergency department, call ahead and tell them about your recent travel, contact with someone diagnosed with COVID-19, and your symptoms. You should receive instructions from your physician's office regarding next steps of care.  . When you arrive at healthcare provider, tell the healthcare staff immediately you have returned from visiting [place], [place], [place], [place] or [place]; or traveled in the [place] to [place], [place], [place], or [place]; in the last two weeks or you have been in close contact with a person diagnosed with COVID-19 in the last 2 weeks.   . Tell the health care staff about your symptoms: fever, cough and shortness of breath. . After you have been seen by a medical provider, you will be either: o Tested for (COVID-19) and discharged home on quarantine except to seek medical care if symptoms worsen, and asked to  - Stay home and avoid contact with others until you get your results (4-5 days)  - Avoid travel on public transportation if possible (such as bus, train, or airplane) or o Sent to the Emergency Department by EMS for evaluation, COVID-19 testing, and possible  admission depending on your condition and test results.  What to do if you are LOW RISK for COVID-19?  Reduce your risk of any infection by using the same precautions used for avoiding the common cold or flu:  . Wash your hands often with soap and warm water for at least 20 seconds.  If soap and water are not readily available, use an alcohol-based hand sanitizer with at least 60% alcohol.  . If coughing or sneezing, cover your mouth and nose by coughing or sneezing into the elbow areas of your shirt or coat, into a tissue or into your sleeve (not your hands). . Avoid shaking hands with others and consider head nods or verbal greetings only. . Avoid touching your eyes, nose, or mouth with unwashed hands.  . Avoid close contact with people who are sick. . Avoid places or events with large numbers of people in one location, like concerts or sporting events. . Carefully consider travel plans you have or are making. . If you are planning any travel outside or inside the [place], visit the CDC's Travelers' Health webpage for the latest health notices. . If you have some symptoms but not all symptoms, continue to monitor at home and seek medical attention if your symptoms worsen. . If you are having a medical emergency, call 911.   ADDITIONAL HEALTHCARE OPTIONS FOR PATIENTS  [facility] / e-Visit: [email or web address]         [facility]: [phone]  [facility]: [phone]                   [facility]: [phone]   

## 2019-10-05 NOTE — Progress Notes (Signed)
Radiation Oncology         [phone] ________________________________  Initial Outpatient Consultation - Conducted via Telephone due to current COVID-19 concerns for limiting patient exposure  Name: Douglas Sims MRN: [id number]  Date: 10/05/2019  DOB: 06-02-1955  ZG:YFVCB, Purcell Nails, MD  McKenzie, Candee Furbish, MD   REFERRING PHYSICIAN: Cleon Gustin, MD  DIAGNOSIS: 64 y.o. gentleman with Stage T1c adenocarcinoma of the prostate with Gleason score of 3+3, and PSA of 8.4.    ICD-10-CM   1. Malignant neoplasm of prostate ([place])  C61     HISTORY OF PRESENT ILLNESS: Douglas Sims is a 64 y.o. male with a diagnosis of prostate cancer and a history of bladder cancer. 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 Odis Luster. He underwent induction BCG through 12/01/2015 under the care of Dr. Rosana Hoes.   More recently, he was noted to have an elevated PSA of 8.4 by his primary care physician, Dr. Gerarda Fraction.  Accordingly, he was referred for evaluation in urology by Dr. Alyson Ingles on 08/18/19,  digital rectal examination was performed at that time revealing no nodules.  The patient proceeded to transrectal ultrasound with 12 biopsies of the prostate on 09/01/19.  The prostate volume measured 34 cc.  Out of 12 core biopsies, 2 were positive.  The maximum Gleason score was 3+3, and this was seen in the left mid lateral (small focus, with PNI) and left apex lateral.  The patient reviewed the biopsy results with his urologist and he has kindly been referred today for discussion of potential radiation treatment options.   PREVIOUS RADIATION THERAPY: No  PAST MEDICAL HISTORY:  Past Medical History:  Diagnosis Date  . Arthritis   . Bladder cancer ([place])   . GERD (gastroesophageal reflux disease)   . Hypertension   . Prostate cancer ([place])   . Wears glasses       PAST SURGICAL HISTORY: Past Surgical History:   Procedure Laterality Date  . BLADDER SURGERY    . COLONOSCOPY    . COLONOSCOPY N/A 01/16/2016   Procedure: COLONOSCOPY;  Surgeon: Danie Binder, MD;  [facility];  Service: Endoscopy;  Laterality: N/A;  230  . FASCIECTOMY Left 06/07/2014   Procedure: LEFT PALM FASCIECTOMY LEFT RING FINGER;  Surgeon: Daryll Brod, MD;  Location: [facility];  Service: Orthopedics;  Laterality: Left;  . PLANTAR FASCIA SURGERY  2007   left foot  . PROSTATE BIOPSY    . TONSILLECTOMY    . UPPER GI ENDOSCOPY      FAMILY HISTORY:  Family History  Problem Relation Age of Onset  . Lung cancer Father   . Breast cancer Neg Hx   . Pancreatic cancer Neg Hx   . Colon cancer Neg Hx   . Prostate cancer Neg Hx     SOCIAL HISTORY:  Social History   Socioeconomic History  . Marital status: Divorced    Spouse name: Not on file  . Number of children: 3  . Years of education: Not on file  . Highest education level: Not on file  Occupational History  . Occupation: disabled  Tobacco Use  . Smoking status: Former Smoker    Types: Cigarettes    Quit date: 06/03/1993    Years since quitting: 26.3  . Smokeless tobacco: Never Used  Substance and Sexual Activity  . Alcohol use: No  . Drug use: No  .  Sexual activity: Yes    Comment: moderate ED  Other Topics Concern  . Not on file  Social History Narrative  . Not on file   Social Determinants of Health   Financial Resource Strain:   . Difficulty of Paying Living Expenses:   Food Insecurity:   . Worried About Charity fundraiser in the Last Year:   . Arboriculturist in the Last Year:   Transportation Needs:   . Film/video editor (Medical):   Marland Kitchen Lack of Transportation (Non-Medical):   Physical Activity:   . Days of Exercise per Week:   . Minutes of Exercise per Session:   Stress:   . Feeling of Stress :   Social Connections:   . Frequency of Communication with Friends and Family:   . Frequency of Social Gatherings with  Friends and Family:   . Attends Religious Services:   . Active Member of Clubs or Organizations:   . Attends Archivist Meetings:   Marland Kitchen Marital Status:   Intimate Partner Violence:   . Fear of Current or Ex-Partner:   . Emotionally Abused:   Marland Kitchen Physically Abused:   . Sexually Abused:     ALLERGIES: Codeine  MEDICATIONS:  Current Outpatient Medications  Medication Sig Dispense Refill  . ALPRAZolam (XANAX) 1 MG tablet     . aspirin 81 MG EC tablet Take by mouth.    . meloxicam (MOBIC) 7.5 MG tablet Take 7.5 mg by mouth daily.     Marland Kitchen omeprazole (PRILOSEC) 20 MG capsule TAKE 1 CAPSULE BY MOUTH TWICE DAILY 30  MINUTES BEFORE BREAKFAST AND DINNER FOR ACID  REFLUX. 60 capsule 11  . linaclotide (LINZESS) 145 MCG CAPS capsule 1 PO with your first meal (Patient not taking: Reported on 10/05/2019) 30 capsule 11   No current facility-administered medications for this encounter.    REVIEW OF SYSTEMS:  On review of systems, the patient reports that he is doing well overall. He denies any chest pain, shortness of breath, cough, fevers, chills, night sweats, unintended weight changes. He denies any bowel disturbances, and denies abdominal pain, nausea or vomiting. He denies any new musculoskeletal or joint aches or pains. Hie reports no bladder complaints, with an IPSS of 7, indicating minimal urinary symptoms. A complete review of systems is obtained and is otherwise negative.    PHYSICAL EXAM:  Wt Readings from Last 3 Encounters:  09/29/19 225 lb (102.1 kg)  08/18/19 220 lb (99.8 kg)  07/10/16 206 lb 12.8 oz (93.8 kg)   Temp Readings from Last 3 Encounters:  09/29/19 (!) 97.5 F (36.4 C)  09/01/19 98.3 F (36.8 C) (Oral)  08/18/19 (!) 97.3 F (36.3 C)   BP Readings from Last 3 Encounters:  09/29/19 133/79  09/01/19 134/86  08/18/19 (!) 145/94   Pulse Readings from Last 3 Encounters:  09/29/19 69  09/01/19 66  08/18/19 74   Pain Assessment Pain Score: 0-No  pain/10  Physical exam not performed in light of telephone consult visit format.   KPS = 100  100 - Normal; no complaints; no evidence of disease. 90   - Able to carry on normal activity; minor signs or symptoms of disease. 80   - Normal activity with effort; some signs or symptoms of disease. 64   - Cares for self; unable to carry on normal activity or to do active work. 60   - Requires occasional assistance, but is able to care for most of his personal needs.  50   - Requires considerable assistance and frequent medical care. 24   - Disabled; requires special care and assistance. 53   - Severely disabled; hospital admission is indicated although death not imminent. 28   - Very sick; hospital admission necessary; active supportive treatment necessary. 10   - Moribund; fatal processes progressing rapidly. 0     - Dead  Karnofsky DA, Abelmann [place], Craver LS and Burchenal [facility] [phone]) The use of the nitrogen mustards in the palliative treatment of carcinoma: with particular reference to bronchogenic carcinoma Cancer 1 634-56  LABORATORY DATA:  Lab Results  Component Value Date   HGB 14.9 06/07/2014   Lab Results  Component Value Date   NA 139 01/09/2016   K 4.1 01/09/2016   CL 106 01/09/2016   CO2 25 01/09/2016   Lab Results  Component Value Date   ALT 20 01/09/2016   AST 15 01/09/2016   ALKPHOS 71 01/09/2016   BILITOT 1.0 01/09/2016     RADIOGRAPHY: No results found.    IMPRESSION/PLAN: This visit was conducted via Telephone to spare the patient unnecessary potential exposure in the healthcare setting during the current COVID-19 pandemic. 1. 64 y.o. gentleman with Stage T1c adenocarcinoma of the prostate with Gleason Score of 3+3, and PSA of 8.4. We discussed the patient's workup and outlined the nature of prostate cancer in this setting. The patient's T stage, Gleason's score, and PSA put him into the low risk group. Accordingly, he is eligible for a variety of potential  treatment options including active surveillance, brachytherapy, 5.5 weeks of external radiation or prostatectomy. We discussed the NCCN guideline recommendation to proceed with Active Surveillance and briefly discussed the available radiation techniques, focusing on the details and logistics of delivery, and compared and contrasted these with prostatectomy.  He was encouraged to ask questions were answered to his stated satisfaction.  Given his low-volume disease, our recommendation for this patient would be active surveillance with close monitoring of the PSA and consideration for a prostate MRI in approximately 6 months to determine any need for a more targeted, fusion biopsy.  He understands that active surveillance typically consists of periodic repeat prostate biopsies, every 12 to 18 months as well as potential for periodic prostate imaging with MRI.  He appears to have a good understanding of his disease and our recommendations.  At the end of the conversation the patient is leaning towards proceeding with active surveillance but plans to keep his scheduled consult visit with Dr. Tresa Moore on 10/12/2019 for further discussion of treatment options.  He was provided our contact information and knows that he is welcome to contact us at anytime with any further questions or concerns in the interim.  Otherwise, we look forward to continuing to follow along in his progress and would be more than happy to continue to participate in his care if clinically indicated in the future.   Given current concerns for patient exposure during the COVID-19 pandemic, this encounter was conducted via telephone. The patient was notified in advance and was offered a MyChart meeting to allow for face to face communication but unfortunately reported that he did not have the appropriate resources/technology to support such a visit and instead preferred to proceed with telephone consult. The patient has given verbal consent for this  type of encounter. The time spent during this encounter was 45 minutes. The attendants for this meeting include Tyler Pita MD, Ashlyn Bruning PA-C, [place], and patient, BASIL BUFFIN. During the  encounter, Tyler Pita MD, Ashlyn Bruning PA-C, and scribe, Wilburn Mylar were located at [place].  Patient, ILARIO DHALIWAL was located at home.    Nicholos Johns, PA-C    Tyler Pita, MD  [facility] Direct Dial: [phone]  Fax: [phone] [email or web address]  Skype  [email or web address]  This document serves as a record of services personally performed by Tyler Pita, MD and Freeman Caldron, PA-C. It was created on their behalf by Wilburn Mylar, a trained medical scribe. The creation of this record is based on the scribe's personal observations and the provider's statements to them. This document has been checked and approved by the attending provider.

## 2019-10-09 ENCOUNTER — Emergency Department (HOSPITAL_COMMUNITY): Payer: Medicare Other

## 2019-10-09 ENCOUNTER — Other Ambulatory Visit: Payer: Self-pay

## 2019-10-09 ENCOUNTER — Encounter (HOSPITAL_COMMUNITY): Payer: Self-pay

## 2019-10-09 ENCOUNTER — Emergency Department (HOSPITAL_COMMUNITY)
Admission: EM | Admit: 2019-10-09 | Discharge: 2019-10-09 | Disposition: A | Discharge status: Home / Self Care | Payer: Medicare Other | Attending: Emergency Medicine | Admitting: Emergency Medicine

## 2019-10-09 DIAGNOSIS — Y9389 Activity, other specified: Secondary | ICD-10-CM | POA: Diagnosis not present

## 2019-10-09 DIAGNOSIS — Z8546 Personal history of malignant neoplasm of prostate: Secondary | ICD-10-CM | POA: Insufficient documentation

## 2019-10-09 DIAGNOSIS — Z79899 Other long term (current) drug therapy: Secondary | ICD-10-CM | POA: Insufficient documentation

## 2019-10-09 DIAGNOSIS — Z23 Encounter for immunization: Secondary | ICD-10-CM | POA: Insufficient documentation

## 2019-10-09 DIAGNOSIS — S0993XA Unspecified injury of face, initial encounter: Secondary | ICD-10-CM | POA: Diagnosis present

## 2019-10-09 DIAGNOSIS — Y998 Other external cause status: Secondary | ICD-10-CM | POA: Insufficient documentation

## 2019-10-09 DIAGNOSIS — Y92810 Car as the place of occurrence of the external cause: Secondary | ICD-10-CM | POA: Insufficient documentation

## 2019-10-09 DIAGNOSIS — Z8551 Personal history of malignant neoplasm of bladder: Secondary | ICD-10-CM | POA: Diagnosis not present

## 2019-10-09 DIAGNOSIS — I10 Essential (primary) hypertension: Secondary | ICD-10-CM | POA: Diagnosis not present

## 2019-10-09 DIAGNOSIS — S025XXA Fracture of tooth (traumatic), initial encounter for closed fracture: Secondary | ICD-10-CM | POA: Insufficient documentation

## 2019-10-09 DIAGNOSIS — Z7982 Long term (current) use of aspirin: Secondary | ICD-10-CM | POA: Diagnosis not present

## 2019-10-09 DIAGNOSIS — Z87891 Personal history of nicotine dependence: Secondary | ICD-10-CM | POA: Insufficient documentation

## 2019-10-09 DIAGNOSIS — S01511A Laceration without foreign body of lip, initial encounter: Secondary | ICD-10-CM | POA: Diagnosis not present

## 2019-10-09 MED ORDER — LIDOCAINE-EPINEPHRINE-TETRACAINE (LET) TOPICAL GEL
3.0000 mL | Freq: Once | TOPICAL | Status: AC
  Administered 2019-10-09: 3 mL via TOPICAL
  Filled 2019-10-09: qty 3

## 2019-10-09 MED ORDER — POVIDONE-IODINE 10 % EX SOLN
CUTANEOUS | Status: DC | PRN
  Administered 2019-10-09: 2 via TOPICAL
  Filled 2019-10-09: qty 30

## 2019-10-09 MED ORDER — HYDROCODONE-ACETAMINOPHEN 5-325 MG PO TABS: 1.0000 | ORAL_TABLET | ORAL | 0 refills | Status: DC | PRN

## 2019-10-09 MED ORDER — TETANUS-DIPHTH-ACELL PERTUSSIS 5-2.5-18.5 LF-MCG/0.5 IM SUSP
0.5000 mL | Freq: Once | INTRAMUSCULAR | Status: AC
  Administered 2019-10-09: 0.5 mL via INTRAMUSCULAR
  Filled 2019-10-09: qty 0.5

## 2019-10-09 MED ORDER — LIDOCAINE-EPINEPHRINE (PF) 2 %-1:200000 IJ SOLN: 10.0000 mL | Freq: Once | INTRAMUSCULAR | Status: DC

## 2019-10-09 NOTE — ED Notes (Signed)
JI in to assess 
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
## 2019-10-09 NOTE — Discharge Instructions (Addendum)
Your sutures will dissolve as discussed.  Get rechecked for any problems with your injured lip.  You will need an oral surgeon as discussed to help with your dental and gum repair.  You may take the hydrocodone prescribed for pain relief.  This will make you drowsy - do not drive within 4 hours of taking this medication.  Avoid foods such as salt, spicy, acidic (lemon, tomato) as these will increase your pain.
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
## 2019-10-26 ENCOUNTER — Encounter: Payer: Self-pay | Admitting: Medical Oncology

## 2019-10-26 NOTE — Progress Notes (Signed)
Left message with patient asking for a return call as follow up to radiation consult with Dr. Tammi Klippel, 6/8. After discussion he was leaning towards continuing active surveillance. He has follow up with Dr. Tresa Moore 6/15 to further discuss. I just asked him to call me to confirm he is continuing surveillance with close monitoring of PSA.

## 2019-10-28 NOTE — Progress Notes (Signed)
Patient called confirming he is going to continue active surveillance for his prostate cancer. I stressed the importance of keeping follow up appointments with urologist with PSA. He will have follow up in 6 months with Dr. Alyson Ingles in [place] office.

## 2019-10-29 ENCOUNTER — Ambulatory Visit (HOSPITAL_COMMUNITY)
Admission: RE | Admit: 2019-10-29 | Discharge: 2019-10-29 | Disposition: A | Discharge status: Home / Self Care | Payer: Medicare Other | Source: Ambulatory Visit | Attending: Urology | Admitting: Urology

## 2019-10-29 ENCOUNTER — Other Ambulatory Visit: Payer: Self-pay

## 2019-10-29 DIAGNOSIS — N2889 Other specified disorders of kidney and ureter: Secondary | ICD-10-CM

## 2019-10-29 LAB — POCT I-STAT CREATININE: Creatinine, Ser: 0.9 mg/dL (ref 0.61–1.24)

## 2019-10-29 MED ORDER — SODIUM CHLORIDE 0.9 % IV SOLN
INTRAVENOUS | Status: AC
  Filled 2019-10-29: qty 250

## 2019-10-29 MED ORDER — IOHEXOL 300 MG/ML  SOLN
125.0000 mL | Freq: Once | INTRAMUSCULAR | Status: AC | PRN
  Administered 2019-10-29: 125 mL via INTRAVENOUS

## 2019-10-29 MED ORDER — SODIUM CHLORIDE 0.9 % IV SOLN
INTRAVENOUS | Status: AC
  Filled 2019-10-29: qty 100

## 2019-10-29 MED ORDER — SODIUM CHLORIDE (PF) 0.9 % IJ SOLN
INTRAMUSCULAR | Status: AC
  Filled 2019-10-29: qty 50

## 2020-04-20 ENCOUNTER — Other Ambulatory Visit: Payer: Self-pay

## 2020-04-20 ENCOUNTER — Ambulatory Visit (INDEPENDENT_AMBULATORY_CARE_PROVIDER_SITE_OTHER): Payer: Medicare Other | Admitting: Urology

## 2020-04-20 ENCOUNTER — Encounter: Payer: Self-pay | Admitting: Urology

## 2020-04-20 VITALS — BP 131/80 | HR 73 | Temp 98.0°F | Ht 73.0 in | Wt 201.0 lb

## 2020-04-20 DIAGNOSIS — C61 Malignant neoplasm of prostate: Secondary | ICD-10-CM

## 2020-04-20 DIAGNOSIS — R972 Elevated prostate specific antigen [PSA]: Secondary | ICD-10-CM | POA: Diagnosis not present

## 2020-04-20 LAB — URINALYSIS, ROUTINE W REFLEX MICROSCOPIC
Bilirubin, UA: NEGATIVE
Ketones, UA: NEGATIVE
Leukocytes,UA: NEGATIVE
Nitrite, UA: NEGATIVE
Protein,UA: NEGATIVE
RBC, UA: NEGATIVE
Specific Gravity, UA: 1.02 (ref 1.005–1.030)
Urobilinogen, Ur: 1 mg/dL (ref 0.2–1.0)
pH, UA: 7.5 (ref 5.0–7.5)

## 2020-04-20 MED ORDER — LEVOFLOXACIN 750 MG PO TABS: 750.0000 mg | ORAL_TABLET | Freq: Every day | ORAL | 0 refills | Status: DC

## 2020-04-20 NOTE — Progress Notes (Signed)
Urological Symptom Review  Patient is experiencing the following symptoms: Frequent urination   Review of Systems  Gastrointestinal (upper)  : Negative for upper GI symptoms  Gastrointestinal (lower) : Negative for lower GI symptoms  Constitutional : Negative for symptoms  Skin: Negative for skin symptoms  Eyes: Blurred vision  Ear/Nose/Throat : Negative for Ear/Nose/Throat symptoms  Hematologic/Lymphatic: Negative for Hematologic/Lymphatic symptoms  Cardiovascular : Negative for cardiovascular symptoms  Respiratory : Negative for respiratory symptoms  Endocrine: Negative for endocrine symptoms  Musculoskeletal: Joint pain  Neurological: Negative for neurological symptoms  Psychologic: Negative for psychiatric symptoms

## 2020-04-20 NOTE — Patient Instructions (Addendum)
Prostate Cancer  The prostate is a male gland that helps make semen. Prostate cancer is when abnormal cells grow in this gland. Follow these instructions at home:  Take over-the-counter and prescription medicines only as told by your doctor.  Eat a healthy diet.  Get plenty of sleep.  Ask your doctor for help to find a support group for men with prostate cancer.  Keep all follow-up visits as told by your doctor. This is important.  If you have to go to the hospital, let your cancer doctor (oncologist) know.  Touch, hold, hug, and caress your partner to continue to show sexual feelings. Contact a doctor if:  You have trouble peeing (urinating).  You have blood in your pee (urine).  You have pain in your hips, back, or chest. Get help right away if:  You have weakness in your legs.  You lose feeling (have numbness) in your legs.  You cannot control your pee or your poop (stool).  You have trouble breathing.  You have sudden pain in your chest.  You have chills or a fever. Summary  The prostate is a male gland that helps make semen. Prostate cancer is when abnormal cells grow in this gland.  Ask your doctor for help to find a support group for men with prostate cancer.  Contact a doctor if you have problems peeing or have any new pain that you did not have before. This information is not intended to replace advice given to you by your health care provider. Make sure you discuss any questions you have with your health care provider. Document Revised: 03/28/2017 Document Reviewed: 12/25/2015 Elsevier Patient Education  2020 [place].        Appointment Time: 12:30p Appointment Date: 07/19/2020  Location: [facility]   Prostate Biopsy Instructions  Stop all aspirin or blood thinners (aspirin, plavix, coumadin, warfarin, motrin, ibuprofen, advil, aleve, naproxen, naprosyn) for 7 days prior to the procedure.  If you have any questions  about stopping these medications, please contact your primary care physician or cardiologist.  Having a light meal prior to the procedure is recommended.  If you are diabetic or have low blood sugar please bring a small snack or glucose tablet.  A Fleets enema is needed to be purchased over the counter at a local pharmacy and used 2 hours before you scheduled appointment.  This can be purchased over the counter at any pharmacy.  Antibiotics will be administered in the clinic at the time of the procedure and 1 tablet has been sent to your pharmacy. Please take the antibiotic as prescribed.    Please bring someone with you to the procedure to drive you home if you are given a valium to take prior to your procedure.   If you have any questions or concerns, please feel free to call the office at [phone] or send a Mychart message.    Thank you, [facility]

## 2020-04-20 NOTE — Addendum Note (Signed)
Addended by: Iris Pert on: 04/20/2020 11:46 AM   Modules accepted: Orders

## 2020-04-20 NOTE — Addendum Note (Signed)
Addended byIris Pert on: 04/20/2020 11:37 AM   Modules accepted: Orders

## 2020-04-20 NOTE — Progress Notes (Signed)
04/20/2020 11:14 AM   Douglas Sims 10-16-1955 [id number]  Referring provider: Redmond School, MD [address] [place],  [place] [address]  Followup prostate cancer  HPI: [name] is a [id number] here for followup for prostate cancer. No recent PSA> He is on active surveillance for low risk prostate cancer. No significant LUTS.  Rare nocturia. No ED   PMH: Past Medical History:  Diagnosis Date  . Arthritis   . Bladder cancer ([place])   . GERD (gastroesophageal reflux disease)   . Hypertension   . Prostate cancer ([place])   . Wears glasses     Surgical History: Past Surgical History:  Procedure Laterality Date  . BLADDER SURGERY    . COLONOSCOPY    . COLONOSCOPY N/A 01/16/2016   Procedure: COLONOSCOPY;  Surgeon: Douglas Binder, MD;  [facility];  Service: Endoscopy;  Laterality: N/A;  230  . FASCIECTOMY Left 06/07/2014   Procedure: LEFT PALM FASCIECTOMY LEFT RING FINGER;  Surgeon: Douglas Brod, MD;  Location: [facility];  Service: Orthopedics;  Laterality: Left;  . PLANTAR FASCIA SURGERY  2007   left foot  . PROSTATE BIOPSY    . TONSILLECTOMY    . UPPER GI ENDOSCOPY      Home Medications:  Allergies as of 04/20/2020      Reactions   Codeine Itching      Medication List       Accurate as of April 20, 2020 11:14 AM. If you have any questions, ask your nurse or doctor.        STOP taking these medications   HYDROcodone-acetaminophen 5-325 MG tablet Commonly known as: NORCO/VICODIN Stopped by: Douglas Bang, MD   linaclotide 145 MCG Caps capsule Commonly known as: Linzess Stopped by: Douglas Bang, MD     TAKE these medications   ALPRAZolam 1 MG tablet Commonly known as: XANAX   aspirin 81 MG EC tablet Take by mouth.   meloxicam 7.5 MG tablet Commonly known as: MOBIC Take 7.5 mg by mouth daily.   omeprazole 20 MG capsule Commonly known as: PRILOSEC TAKE 1 CAPSULE BY MOUTH TWICE DAILY 30  MINUTES BEFORE BREAKFAST AND  DINNER FOR ACID  REFLUX.       Allergies:  Allergies  Allergen Reactions  . Codeine Itching    Family History: Family History  Problem Relation Age of Onset  . Lung cancer Father   . Breast cancer Neg Hx   . Pancreatic cancer Neg Hx   . Colon cancer Neg Hx   . Prostate cancer Neg Hx     Social History:  reports that he quit smoking about 26 years ago. His smoking use included cigarettes. He has never used smokeless tobacco. He reports that he does not drink alcohol and does not use drugs.  ROS: All other review of systems were reviewed and are negative except what is noted above in HPI  Physical Exam: BP 131/80   Pulse 73   Temp 98 F (36.7 C)   Ht 6\' 1"  (1.854 m)   Wt 201 lb (91.2 kg)   BMI 26.52 kg/m   Constitutional:  Alert and oriented, No acute distress. HEENT: Douglas Sims AT, moist mucus membranes.  Trachea midline, no masses. Cardiovascular: No clubbing, cyanosis, or edema. Respiratory: Normal respiratory effort, no increased work of breathing. GI: Abdomen is soft, nontender, nondistended, no abdominal masses GU: No CVA tenderness.  Lymph: No cervical or inguinal lymphadenopathy. Skin: No rashes, bruises or suspicious lesions. Neurologic: Grossly  intact, no focal deficits, moving all 4 extremities. Psychiatric: Normal mood and affect.  Laboratory Data: Lab Results  Component Value Date   HGB 14.9 06/07/2014    Lab Results  Component Value Date   CREATININE 0.90 10/29/2019    No results found for: PSA  No results found for: TESTOSTERONE  No results found for: HGBA1C  Urinalysis    Component Value Date/Time   BILIRUBINUR neg 09/29/2019 1641   PROTEINUR Negative 09/29/2019 1641   UROBILINOGEN 4.0 (A) 09/29/2019 1641   NITRITE neg 09/29/2019 1641   LEUKOCYTESUR Negative 09/29/2019 1641    No results found for: LABMICR, [place], RBCUA, LABEPIT, MUCUS, BACTERIA  Pertinent Imaging:  No results found for this or any previous visit.  No results found  for this or any previous visit.  No results found for this or any previous visit.  No results found for this or any previous visit.  No results found for this or any previous visit.  No results found for this or any previous visit.  Results for orders placed during the hospital encounter of 10/29/19  CT HEMATURIA WORKUP  Narrative CLINICAL DATA:  Ureterocele, history of bladder cancer post prostate biopsy.  EXAM: CT ABDOMEN AND PELVIS WITHOUT AND WITH CONTRAST  TECHNIQUE: Multidetector CT imaging of the abdomen and pelvis was performed following the standard protocol before and following the bolus administration of intravenous contrast.  CONTRAST:  126mL OMNIPAQUE IOHEXOL 300 MG/ML  SOLN  COMPARISON:  MRI of the abdomen from 2017  FINDINGS: Lower chest: Small pulmonary nodule in the LEFT lung base. 5 mm. No consolidation. No pleural effusion. Small pulmonary nodules at the RIGHT lung base (image 68, series 4) largest 3 mm. Also with a small juxta diaphragmatic nodule in the RIGHT chest approximately 4 mm.  Small nodule along the minor fissure in the RIGHT chest (image 14, series 4) 4 mm. Other smaller nodules seen at the lung bases including calcified granulomas.  Hepatobiliary: Signs of hepatic steatosis with similar appearance to the prior imaging study. Is signs of hepatic cysts and hemangiomata without change. Portal vein is patent.  Hepatic veins are patent. No biliary duct dilation. Lesion of the gallbladder fundus showing increased density and more solid appearance since previous imaging measuring 1 by 1.1 cm. No pericholecystic stranding. There is some calcification. Size is stable when compared to the prior study and there was a suggestion of a biliary calculus within this location.  Pancreas: Pancreas without focal lesion or ductal dilation.  Spleen: Spleen normal size and contour.  Adrenals/Urinary Tract: Adrenal glands are normal.  Kidneys with  mild cortical scarring. RIGHT renal cyst unchanged from previous imaging. No hydronephrosis. No nephrolithiasis.  Symmetric renal enhancement. No signs of abnormal enhancement along the is visualized urothelium.  Urinary bladder with mass effect from a reservoir or for a penile prosthesis. Deviation from LEFT to RIGHT within the pelvis. Limited distension without visible focal  Stomach/Bowel: Stomach grossly normal. Small bowel without acute process. Appendix is normal. Diffuse colonic diverticulosis.  Vascular/Lymphatic: No aneurysmal dilation of the abdominal aorta. No adenopathy in the retroperitoneum or upper abdomen.  No pelvic lymphadenopathy.  Thickening.  Reproductive: Prostate with heterogeneity, nonspecific finding on CT. Penile prosthesis in place.  Other: No ascites.  Musculoskeletal: Spinal degenerative changes. No acute or destructive bone process.  Excretory imaging: No visible filling defect or sign of stricture on excretory phase evaluation.  IMPRESSION: 1. Lesion of the gallbladder fundus showing increased density and more solid appearance since previous  imaging measuring 1 by 1.1 cm. Size is stable when compared to the prior study and there was a suggestion of a biliary calculus within this location. Suggest follow-up ultrasound for further assessment. This could represent adenomyomatosis of the gallbladder fundus, associated with biliary calculus in the internal portion. Ultrasound is a good first step in the further evaluation of this finding. Gallbladder neoplasm is felt unlikely but is not excluded. If ultrasound is unrevealing is or equivocal MRI could be performed. 2. Signs of hepatic steatosis with similar appearance to the prior imaging. 3. Small pulmonary nodules at the lung bases, largest 5 mm. No follow-up needed if patient is low-risk (and has no known or suspected primary neoplasm). Non-contrast chest CT can be considered in 12 months if  patient is high-risk. This recommendation follows the consensus statement: Guidelines for Management of Incidental Pulmonary Nodules Detected on CT Images: From the Fleischner Society 2017; Radiology 2017; 284:228-243. 4. Diffuse colonic diverticulosis without evidence of acute diverticulitis. 5. Prostate with heterogeneity, nonspecific finding on CT. 6. Penile prosthesis in place. 7. Aortic atherosclerosis.  Aortic Atherosclerosis (ICD10-I70.0).   Electronically Signed By: Zetta Bills M.D. On: 10/30/2019 16:03  No results found for this or any previous visit.   Assessment & Plan:    1. Prostate cancer ([place]) -PSA today, will call with results -RTC 3 months for prostate biopsy - Urinalysis, Routine w reflex microscopic - PSA   No follow-ups on file.  Douglas Bang, MD  [facility] [place]

## 2020-04-21 LAB — PSA: Prostate Specific Ag, Serum: 8.3 ng/mL — ABNORMAL HIGH (ref 0.0–4.0)

## 2020-04-26 ENCOUNTER — Telehealth: Payer: Self-pay

## 2020-04-26 NOTE — Telephone Encounter (Signed)
-----   Message from Malen Gauze, MD sent at 04/25/2020 11:10 AM EST ----- PSa stable ----- Message ----- From: Ferdinand Lango, RN Sent: 04/25/2020  10:04 AM EST To: Malen Gauze, MD  Please review

## 2020-04-26 NOTE — Telephone Encounter (Signed)
Pt notified of results

## 2020-04-26 NOTE — Telephone Encounter (Signed)
Pt called. No answer. Left message.  °

## 2020-05-12 ENCOUNTER — Other Ambulatory Visit: Payer: Medicare Other | Admitting: Urology

## 2020-05-19 ENCOUNTER — Ambulatory Visit: Payer: Medicare Other | Admitting: Urology

## 2020-06-22 DIAGNOSIS — K006 Disturbances in tooth eruption: Secondary | ICD-10-CM | POA: Diagnosis not present

## 2020-06-28 DIAGNOSIS — Z0100 Encounter for examination of eyes and vision without abnormal findings: Secondary | ICD-10-CM | POA: Diagnosis not present

## 2020-06-28 DIAGNOSIS — H5200 Hypermetropia, unspecified eye: Secondary | ICD-10-CM | POA: Diagnosis not present

## 2020-07-19 ENCOUNTER — Ambulatory Visit (INDEPENDENT_AMBULATORY_CARE_PROVIDER_SITE_OTHER): Payer: Medicare HMO | Admitting: Urology

## 2020-07-19 ENCOUNTER — Encounter (HOSPITAL_COMMUNITY): Payer: Self-pay

## 2020-07-19 ENCOUNTER — Other Ambulatory Visit: Payer: Self-pay | Admitting: Urology

## 2020-07-19 ENCOUNTER — Ambulatory Visit (HOSPITAL_COMMUNITY)
Admission: RE | Admit: 2020-07-19 | Discharge: 2020-07-19 | Disposition: A | Discharge status: Home / Self Care | Payer: Medicare HMO | Source: Ambulatory Visit | Attending: Urology | Admitting: Urology

## 2020-07-19 ENCOUNTER — Other Ambulatory Visit: Payer: Self-pay

## 2020-07-19 DIAGNOSIS — C61 Malignant neoplasm of prostate: Secondary | ICD-10-CM | POA: Diagnosis not present

## 2020-07-19 DIAGNOSIS — N4289 Other specified disorders of prostate: Secondary | ICD-10-CM | POA: Diagnosis not present

## 2020-07-19 DIAGNOSIS — R9721 Rising PSA following treatment for malignant neoplasm of prostate: Secondary | ICD-10-CM | POA: Diagnosis present

## 2020-07-19 DIAGNOSIS — N411 Chronic prostatitis: Secondary | ICD-10-CM | POA: Diagnosis not present

## 2020-07-19 MED ORDER — LIDOCAINE HCL (PF) 2 % IJ SOLN
INTRAMUSCULAR | Status: AC
  Administered 2020-07-19: 10 mL
  Filled 2020-07-19: qty 10

## 2020-07-19 MED ORDER — GENTAMICIN SULFATE 40 MG/ML IJ SOLN: 80.0000 mg | Freq: Once | INTRAMUSCULAR | Status: AC

## 2020-07-19 MED ORDER — GENTAMICIN SULFATE 40 MG/ML IJ SOLN
INTRAMUSCULAR | Status: AC
  Administered 2020-07-19: 80 mg via INTRAMUSCULAR
  Filled 2020-07-19: qty 2

## 2020-07-19 MED ORDER — LIDOCAINE HCL (PF) 2 % IJ SOLN: 10.0000 mL | Freq: Once | INTRAMUSCULAR | Status: AC

## 2020-07-19 NOTE — Discharge Instructions (Signed)

## 2020-07-19 NOTE — Sedation Documentation (Signed)
PT tolerated prostate biopsy procedure and IM antibiotic injection well today. Labs obtained and sent for pathology. PT ambulatory at discharge with no acute distress noted and verbalized understanding of discharge instructions. PT to follow up with urologist as scheduled.

## 2020-07-25 ENCOUNTER — Encounter: Payer: Self-pay | Admitting: Urology

## 2020-07-25 NOTE — Patient Instructions (Signed)

## 2020-07-25 NOTE — Progress Notes (Signed)
Prostate Biopsy Procedure   Informed consent was obtained after discussing risks/benefits of the procedure.  A time out was performed to ensure correct patient identity.  Pre-Procedure: - Last PSA Level: No results found for: PSA - Gentamicin given prophylactically - Levaquin 500 mg administered PO -Transrectal Ultrasound performed revealing a 43 gm prostate -No significant hypoechoic or median lobe noted  Procedure: - Prostate block performed using 10 cc 1% lidocaine and biopsies taken from sextant areas, a total of 12 under ultrasound guidance.  Post-Procedure: - Patient tolerated the procedure well - He was counseled to seek immediate medical attention if experiences any severe pain, significant bleeding, or fevers - Return in one week to discuss biopsy results

## 2020-07-26 ENCOUNTER — Ambulatory Visit: Payer: Medicare Other | Admitting: Urology

## 2020-08-03 ENCOUNTER — Telehealth: Payer: Self-pay

## 2020-08-03 NOTE — Telephone Encounter (Signed)
Pt called and left message he wanted the results of his biopsy. Pt missed his follow up biopsy appointment. I attempted to reschedule. The next available time is 08/21/20 at 4:00. I called both numbers listed for pts home and alternate contact and both numbers did not work as if disconnected.

## 2020-08-18 ENCOUNTER — Telehealth: Payer: Self-pay

## 2020-08-18 NOTE — Telephone Encounter (Signed)
Patient needing to know results.  Please advise.  Thanks, Helene Kelp

## 2020-08-23 ENCOUNTER — Other Ambulatory Visit: Payer: Self-pay

## 2020-08-23 ENCOUNTER — Telehealth (INDEPENDENT_AMBULATORY_CARE_PROVIDER_SITE_OTHER): Payer: Medicare HMO | Admitting: Urology

## 2020-08-23 DIAGNOSIS — C61 Malignant neoplasm of prostate: Secondary | ICD-10-CM

## 2020-08-23 NOTE — Progress Notes (Signed)
08/23/2020 4:33 PM   Douglas Sims [birth year] [id number]  Referring provider: Redmond School, [place],  [place] [address]  Patient location: home Physician location: office I connected with  Douglas Sims on 08/31/20 by a video enabled telemedicine application and verified that I am speaking with the correct person using two identifiers.   I discussed the limitations of evaluation and management by telemedicine. The patient expressed understanding and agreed to proceed.    followup prostate biopsy  HPI: Douglas Sims is a 27NL here to discuss his pathology report. Pathology revealed Gleason 3+4=7 in 1/12 cores. PSA 8.3. no significant LUTS.    PMH: Past Medical History:  Diagnosis Date  . Arthritis   . Bladder cancer ([place])   . GERD (gastroesophageal reflux disease)   . Hypertension   . Prostate cancer ([place])   . Wears glasses     Surgical History: Past Surgical History:  Procedure Laterality Date  . BLADDER SURGERY    . COLONOSCOPY    . COLONOSCOPY N/A 01/16/2016   Procedure: COLONOSCOPY;  Surgeon: Danie Binder, MD;  [facility];  Service: Endoscopy;  Laterality: N/A;  230  . FASCIECTOMY Left 06/07/2014   Procedure: LEFT PALM FASCIECTOMY LEFT RING FINGER;  Surgeon: Daryll Brod, MD;  Location: [facility];  Service: Orthopedics;  Laterality: Left;  . PLANTAR FASCIA SURGERY  2007   left foot  . PROSTATE BIOPSY    . TONSILLECTOMY    . UPPER GI ENDOSCOPY      Home Medications:  Allergies as of 08/23/2020      Reactions   Codeine Itching      Medication List       Accurate as of August 23, 2020  4:33 PM. If you have any questions, ask your nurse or doctor.        ALPRAZolam 1 MG tablet Commonly known as: XANAX   aspirin 81 MG EC tablet Take by mouth.   levofloxacin 750 MG tablet Commonly known as: Levaquin Take 1 tablet (750 mg total) by mouth daily. To take the morning of biopsy 07/19/20   meloxicam 7.5 MG  tablet Commonly known as: MOBIC Take 7.5 mg by mouth daily.   omeprazole 20 MG capsule Commonly known as: PRILOSEC TAKE 1 CAPSULE BY MOUTH TWICE DAILY 30  MINUTES BEFORE BREAKFAST AND DINNER FOR ACID  REFLUX.       Allergies:  Allergies  Allergen Reactions  . Codeine Itching    Family History: Family History  Problem Relation Age of Onset  . Lung cancer Father   . Breast cancer Neg Hx   . Pancreatic cancer Neg Hx   . Colon cancer Neg Hx   . Prostate cancer Neg Hx     Social History:  reports that he quit smoking about 27 years ago. His smoking use included cigarettes. He has never used smokeless tobacco. He reports that he does not drink alcohol and does not use drugs.  ROS: All other review of systems were reviewed and are negative except what is noted above in HPI  Physical Exam: There were no vitals taken for this visit.  Constitutional:  Alert and oriented, No acute distress. HEENT: [place] AT, moist mucus membranes.  Trachea midline, no masses. Cardiovascular: No clubbing, cyanosis, or edema. Respiratory: Normal respiratory effort, no increased work of breathing. GI: Abdomen is soft, nontender, nondistended, no abdominal masses GU: No CVA tenderness.  Lymph: No cervical or inguinal lymphadenopathy. Skin: No  rashes, bruises or suspicious lesions. Neurologic: Grossly intact, no focal deficits, moving all 4 extremities. Psychiatric: Normal mood and affect.  Laboratory Data: Lab Results  Component Value Date   HGB 14.9 06/07/2014    Lab Results  Component Value Date   CREATININE 0.90 10/29/2019    No results found for: PSA  No results found for: TESTOSTERONE  No results found for: HGBA1C  Urinalysis    Component Value Date/Time   APPEARANCEUR Clear 04/20/2020 1029   GLUCOSEU 1+ (A) 04/20/2020 1029   BILIRUBINUR Negative 04/20/2020 1029   PROTEINUR Negative 04/20/2020 1029   UROBILINOGEN 4.0 (A) 09/29/2019 1641   NITRITE Negative 04/20/2020 1029    LEUKOCYTESUR Negative 04/20/2020 1029    Lab Results  Component Value Date   LABMICR Comment 04/20/2020    Pertinent Imaging:  No results found for this or any previous visit.  No results found for this or any previous visit.  No results found for this or any previous visit.  No results found for this or any previous visit.  No results found for this or any previous visit.  No results found for this or any previous visit.  Results for orders placed during the hospital encounter of 10/29/19  CT HEMATURIA WORKUP  Narrative CLINICAL DATA:  Ureterocele, history of bladder cancer post prostate biopsy.  EXAM: CT ABDOMEN AND PELVIS WITHOUT AND WITH CONTRAST  TECHNIQUE: Multidetector CT imaging of the abdomen and pelvis was performed following the standard protocol before and following the bolus administration of intravenous contrast.  CONTRAST:  172mL OMNIPAQUE IOHEXOL 300 MG/ML  SOLN  COMPARISON:  MRI of the abdomen from 2017  FINDINGS: Lower chest: Small pulmonary nodule in the LEFT lung base. 5 mm. No consolidation. No pleural effusion. Small pulmonary nodules at the RIGHT lung base (image 68, series 4) largest 3 mm. Also with a small juxta diaphragmatic nodule in the RIGHT chest approximately 4 mm.  Small nodule along the minor fissure in the RIGHT chest (image 14, series 4) 4 mm. Other smaller nodules seen at the lung bases including calcified granulomas.  Hepatobiliary: Signs of hepatic steatosis with similar appearance to the prior imaging study. Is signs of hepatic cysts and hemangiomata without change. Portal vein is patent.  Hepatic veins are patent. No biliary duct dilation. Lesion of the gallbladder fundus showing increased density and more solid appearance since previous imaging measuring 1 by 1.1 cm. No pericholecystic stranding. There is some calcification. Size is stable when compared to the prior study and there was a suggestion of a biliary  calculus within this location.  Pancreas: Pancreas without focal lesion or ductal dilation.  Spleen: Spleen normal size and contour.  Adrenals/Urinary Tract: Adrenal glands are normal.  Kidneys with mild cortical scarring. RIGHT renal cyst unchanged from previous imaging. No hydronephrosis. No nephrolithiasis.  Symmetric renal enhancement. No signs of abnormal enhancement along the is visualized urothelium.  Urinary bladder with mass effect from a reservoir or for a penile prosthesis. Deviation from LEFT to RIGHT within the pelvis. Limited distension without visible focal  Stomach/Bowel: Stomach grossly normal. Small bowel without acute process. Appendix is normal. Diffuse colonic diverticulosis.  Vascular/Lymphatic: No aneurysmal dilation of the abdominal aorta. No adenopathy in the retroperitoneum or upper abdomen.  No pelvic lymphadenopathy.  Thickening.  Reproductive: Prostate with heterogeneity, nonspecific finding on CT. Penile prosthesis in place.  Other: No ascites.  Musculoskeletal: Spinal degenerative changes. No acute or destructive bone process.  Excretory imaging: No visible filling defect or sign  of stricture on excretory phase evaluation.  IMPRESSION: 1. Lesion of the gallbladder fundus showing increased density and more solid appearance since previous imaging measuring 1 by 1.1 cm. Size is stable when compared to the prior study and there was a suggestion of a biliary calculus within this location. Suggest follow-up ultrasound for further assessment. This could represent adenomyomatosis of the gallbladder fundus, associated with biliary calculus in the internal portion. Ultrasound is a good first step in the further evaluation of this finding. Gallbladder neoplasm is felt unlikely but is not excluded. If ultrasound is unrevealing is or equivocal MRI could be performed. 2. Signs of hepatic steatosis with similar appearance to the prior imaging. 3.  Small pulmonary nodules at the lung bases, largest 5 mm. No follow-up needed if patient is low-risk (and has no known or suspected primary neoplasm). Non-contrast chest CT can be considered in 12 months if patient is high-risk. This recommendation follows the consensus statement: Guidelines for Management of Incidental Pulmonary Nodules Detected on CT Images: From the Fleischner Society 2017; Radiology 2017; 284:228-243. 4. Diffuse colonic diverticulosis without evidence of acute diverticulitis. 5. Prostate with heterogeneity, nonspecific finding on CT. 6. Penile prosthesis in place. 7. Aortic atherosclerosis.  Aortic Atherosclerosis (ICD10-I70.0).   Electronically Signed By: Zetta Bills M.D. On: 10/30/2019 16:03  No results found for this or any previous visit.   Assessment & Plan:    1. Prostate cancer [facility]) I discussed the natural history of favorable intermediate risk prostate cancer with the patient and the various treatment options including active surveillance, RALP, IMRT, brachytherapy, cryotherapy, HIFU and ADT. After discussing the options the patient elects for RALP versus brachytherapy. I refer him to Dr. Tresa Moore for consideration of RALP and Dr. Tammi Klippel for consideration of brachytherapy    No follow-ups on file.  Nicolette Bang, MD  [facility] [place]

## 2020-08-30 ENCOUNTER — Encounter: Payer: Self-pay | Admitting: Urology

## 2020-08-30 NOTE — Progress Notes (Signed)
Patient denies any hematuria or dysuria.Patient reports nocturia x2. Patient denies any leakage or urgency. Patient reports a strong stream. Patients states that he empties his  Bladder with urination. Patient denies having to push or strain to start his stream. Patient reports a  straight stream.Patient denies any issues with his bowels. Patient states that he had an appointment with his urologist on 08/23/20.Patients ipps was a 2

## 2020-08-31 ENCOUNTER — Encounter: Payer: Self-pay | Admitting: Urology

## 2020-08-31 NOTE — Patient Instructions (Signed)
Prostate Cancer  The prostate is a male gland that helps make semen. It is located below a man's bladder, in front of the rectum. Prostate cancer is when abnormal cells grow in this gland. What are the causes? The cause of this condition is not known. What increases the risk? You are more likely to develop this condition if:  You are 67 years of age or older.  You are African American.  You have a family history of prostate cancer.  You have a family history of breast cancer. What are the signs or symptoms? Symptoms of this condition include:  A need to pee often.  Peeing that is weak, or pee that stops and starts.  Trouble starting or stopping your pee.  Inability to pee.  Blood in your pee or semen.  Pain in the lower back, lower belly (abdomen), hips, or upper thighs.  Trouble getting an erection.  Trouble emptying all of your pee. How is this treated? Treatment for this condition depends on your age, your health, the kind of treatment you like, and how far the cancer has spread. Treatments include:  Being watched. This is called observation. You will be tested from time to time, but you will not get treated. Tests are to make sure that the cancer is not growing.  Surgery. This may be done to remove the prostate, to remove the testicles, or to freeze or kill cancer cells.  Radiation. This uses a strong beam to kill cancer cells.  Ultrasound energy. This uses strong sound waves to kill cancer cells.  Chemotherapy. This uses medicines that stop cancer cells from increasing. This kills cancer cells and healthy cells.  Targeted therapy. This kills cancer cells only. Healthy cells are not affected.  Hormone treatment. This stops the body from making hormones that help the cancer cells to grow. Follow these instructions at home:  Take over-the-counter and prescription medicines only as told by your doctor.  Eat a healthy diet.  Get plenty of sleep.  Ask your  doctor for help to find a support group for men with prostate cancer.  If you have to go to the hospital, let your cancer doctor (oncologist) know.  Treatment may affect your ability to have sex. Touch, hold, hug, and caress your partner to have intimate moments.  Keep all follow-up visits as told by your doctor. This is important. Contact a doctor if:  You have new or more trouble peeing.  You have new or more blood in your pee.  You have new or more pain in your hips, back, or chest. Get help right away if:  You have weakness in your legs.  You lose feeling in your legs.  You cannot control your pee or your poop (stool).  You have chills or a fever. Summary  The prostate is a male gland that helps make semen.  Prostate cancer is when abnormal cells grow in this gland.  Treatment includes doing surgery, using medicines, using very strong beams, or watching without treatment.  Ask your doctor for help to find a support group for men with prostate cancer.  Contact a doctor if you have problems peeing or have any new pain that you did not have before. This information is not intended to replace advice given to you by your health care provider. Make sure you discuss any questions you have with your health care provider. Document Revised: 03/30/2019 Document Reviewed: 03/30/2019 Elsevier Patient Education  2021 Elsevier Inc.  

## 2020-09-01 ENCOUNTER — Ambulatory Visit
Admission: RE | Admit: 2020-09-01 | Discharge: 2020-09-01 | Disposition: A | Discharge status: Home / Self Care | Payer: Medicare HMO | Source: Ambulatory Visit | Attending: Urology | Admitting: Urology

## 2020-09-01 ENCOUNTER — Other Ambulatory Visit: Payer: Self-pay

## 2020-09-01 DIAGNOSIS — C61 Malignant neoplasm of prostate: Secondary | ICD-10-CM

## 2020-09-01 NOTE — Progress Notes (Signed)
Radiation Oncology         [phone] ________________________________  Outpatient Re-Consultation - Conducted via Telephone due to current COVID-19 concerns for limiting patient exposure  Name: Douglas Sims MRN: [id number]  Date: 09/01/2020  DOB: 1955-09-28  VV:OHYWV, Purcell Nails, MD  McKenzie, Candee Furbish, MD   REFERRING PHYSICIAN: Cleon Gustin, MD  DIAGNOSIS: 65 y.o. gentleman with Stage T1c adenocarcinoma of the prostate with Gleason score of 3+4, and PSA of 8.3.    ICD-10-CM   1. Malignant neoplasm of prostate ([place])  C61     HISTORY OF PRESENT ILLNESS: Douglas Sims is a 65 y.o. male with a diagnosis of prostate cancer and a history of bladder cancer. 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 Odis Luster. He underwent induction BCG through 12/01/15 under the care of Dr. Rosana Hoes and has been NED on surveillance cystoscopies since that time.   Initially, he was noted to have an elevated PSA of 8.4 by his primary care physician, Dr. Gerarda Fraction.  Accordingly, he was referred for evaluation in urology by Dr. Alyson Ingles on 08/18/19,  digital rectal examination was performed at that time revealing no nodules.  The patient proceeded to transrectal ultrasound with 12 biopsies of the prostate on 09/01/19.  The prostate volume measured 34 cc.  Out of 12 core biopsies, 2 were positive.  The maximum Gleason score was 3+3, and this was seen in the left mid lateral (small focus, with PNI) and left apex lateral.  We met with the patient on 10/05/19 and he also met with Dr. Tresa Moore on 10/12/19. At that time, he appropriately opted to proceed with active surveillance and continued follow up with Dr. Alyson Ingles. A repeat PSA on 04/20/20 showed stability at 8.3. He proceeded to surveillance prostate biopsy on 07/19/20. Out of 12 core biopsies, only 1 was positive. The left apex lateral showed Gleason 3+4 disease, with  perineural invasion, upstaging his disease to favorable intermediate risk.  The patient reviewed the biopsy results with his urologist and he has kindly been referred today for discussion of potential radiation treatment options. He reports he is leaning towards prostatectomy but is awaiting a call from [facility] to schedule follow up with Dr. Tresa Moore.   PREVIOUS RADIATION THERAPY: No  PAST MEDICAL HISTORY:  Past Medical History:  Diagnosis Date  . Arthritis   . Bladder cancer ([place])   . GERD (gastroesophageal reflux disease)   . Hypertension   . Prostate cancer ([place])   . Wears glasses       PAST SURGICAL HISTORY: Past Surgical History:  Procedure Laterality Date  . BLADDER SURGERY    . COLONOSCOPY    . COLONOSCOPY N/A 01/16/2016   Procedure: COLONOSCOPY;  Surgeon: Danie Binder, MD;  [facility];  Service: Endoscopy;  Laterality: N/A;  230  . FASCIECTOMY Left 06/07/2014   Procedure: LEFT PALM FASCIECTOMY LEFT RING FINGER;  Surgeon: Daryll Brod, MD;  Location: [facility] [place];  Service: Orthopedics;  Laterality: Left;  . PLANTAR FASCIA SURGERY  2007   left foot  . PROSTATE BIOPSY    . TONSILLECTOMY    . UPPER GI ENDOSCOPY      FAMILY HISTORY:  Family History  Problem Relation Age of Onset  . Lung cancer Father   . Breast cancer Neg Hx   . Pancreatic cancer Neg Hx   . Colon cancer Neg Hx   .  Prostate cancer Neg Hx     SOCIAL HISTORY:  Social History   Socioeconomic History  . Marital status: Divorced    Spouse name: Not on file  . Number of children: 3  . Years of education: Not on file  . Highest education level: Not on file  Occupational History  . Occupation: disabled  Tobacco Use  . Smoking status: Former Smoker    Types: Cigarettes    Quit date: 06/03/1993    Years since quitting: 27.2  . Smokeless tobacco: Never Used  Vaping Use  . Vaping Use: Never used  Substance and Sexual Activity  . Alcohol use: No  . Drug use: No  .  Sexual activity: Yes    Comment: moderate ED  Other Topics Concern  . Not on file  Social History Narrative  . Not on file   Social Determinants of Health   Financial Resource Strain: Not on file  Food Insecurity: Not on file  Transportation Needs: Not on file  Physical Activity: Not on file  Stress: Not on file  Social Connections: Not on file  Intimate Partner Violence: Not on file    ALLERGIES: Codeine  MEDICATIONS:  Current Outpatient Medications  Medication Sig Dispense Refill  . ALPRAZolam (XANAX) 1 MG tablet     . meloxicam (MOBIC) 7.5 MG tablet Take 7.5 mg by mouth daily.     Marland Kitchen omeprazole (PRILOSEC) 20 MG capsule TAKE 1 CAPSULE BY MOUTH TWICE DAILY 30  MINUTES BEFORE BREAKFAST AND DINNER FOR ACID  REFLUX. 60 capsule 11  . aspirin 81 MG EC tablet Take by mouth. (Patient not taking: Reported on 08/30/2020)    . levofloxacin (LEVAQUIN) 750 MG tablet Take 1 tablet (750 mg total) by mouth daily. To take the morning of biopsy 07/19/20 (Patient not taking: Reported on 08/30/2020) 1 tablet 0   No current facility-administered medications for this encounter.    REVIEW OF SYSTEMS:  On review of systems, the patient reports that he is doing well overall. He denies any chest pain, shortness of breath, cough, fevers, chills, night sweats, unintended weight changes. He denies any bowel disturbances, and denies abdominal pain, nausea or vomiting. He denies any new musculoskeletal or joint aches or pains. He reports no bladder complaints, with an IPSS of 2, indicating minimal urinary symptoms. His only complaint today is nocturia x2. A complete review of systems is obtained and is otherwise negative.    PHYSICAL EXAM:  Wt Readings from Last 3 Encounters:  04/20/20 201 lb (91.2 kg)  10/09/19 225 lb (102.1 kg)  09/29/19 225 lb (102.1 kg)   Temp Readings from Last 3 Encounters:  07/19/20 98 F (36.7 C) (Oral)  04/20/20 98 F (36.7 C)  10/09/19 97.6 F (36.4 C) (Oral)   BP Readings  from Last 3 Encounters:  07/19/20 (!) 143/78  04/20/20 131/80  10/09/19 (!) 141/98   Pulse Readings from Last 3 Encounters:  07/19/20 80  04/20/20 73  10/09/19 69    /10  Physical exam not performed in light of telephone consult visit format.   KPS = 100  100 - Normal; no complaints; no evidence of disease. 90   - Able to carry on normal activity; minor signs or symptoms of disease. 80   - Normal activity with effort; some signs or symptoms of disease. 20   - Cares for self; unable to carry on normal activity or to do active work. 60   - Requires occasional assistance, but is able to  care for most of his personal needs. 50   - Requires considerable assistance and frequent medical care. 61   - Disabled; requires special care and assistance. 20   - Severely disabled; hospital admission is indicated although death not imminent. 62   - Very sick; hospital admission necessary; active supportive treatment necessary. 10   - Moribund; fatal processes progressing rapidly. 0     - Dead  Karnofsky DA, Abelmann Freeburn, Craver LS and [facility] [phone]) The use of the nitrogen mustards in the palliative treatment of carcinoma: with particular reference to bronchogenic carcinoma Cancer 1 [phone]  LABORATORY DATA:  Lab Results  Component Value Date   HGB 14.9 06/07/2014   Lab Results  Component Value Date   NA 139 01/09/2016   K 4.1 01/09/2016   CL 106 01/09/2016   CO2 25 01/09/2016   Lab Results  Component Value Date   ALT 20 01/09/2016   AST 15 01/09/2016   ALKPHOS 71 01/09/2016   BILITOT 1.0 01/09/2016     RADIOGRAPHY: No results found.    IMPRESSION/PLAN: This visit was conducted via Telephone to spare the patient unnecessary potential exposure in the healthcare setting during the current COVID-19 pandemic. 1. 64 y.o. gentleman with Stage T1c adenocarcinoma of the prostate with Gleason Score of 3+4, and PSA of 8.3. We discussed the patient's workup and outlined the nature of  prostate cancer in this setting. The patient's T stage, Gleason's score, and PSA put him into the favorable intermediate risk group. Accordingly, he is eligible for a variety of potential treatment options including brachytherapy, 5.5 weeks of external radiation, or prostatectomy. The patient is most interested in surgery and had no additional questions or concerns related to radiotherapy. We discussed briefly that radiation therapy can be used as an adjuvant or salvage therapy should he have any high risk features on his final pathology, such as positive margins, positive lymph nodes, or extracapsular extension and/or detectable postoperative PSA. He appears to have a good understanding of his disease and is very firm in his decision to proceed with surgery, noting that he would like to move forward with prostatectomy as soon as possible.  We will share our discussion with Dr. Alyson Ingles and Dr. Tresa Moore and look forward to following his progress.  Given current concerns for patient exposure during the COVID-19 pandemic, this encounter was conducted via telephone. The patient was notified in advance and was offered a MyChart meeting to allow for face to face communication but unfortunately reported that he did not have the appropriate resources/technology to support such a visit and instead preferred to proceed with telephone consult. The patient has given verbal consent for this type of encounter. The time spent during this encounter was 45 minutes. The attendants for this meeting include Tyler Pita MD, Ashlyn Bruning PA-C, [place], and patient, Douglas Sims. During the encounter, Tyler Pita MD, Ashlyn Bruning PA-C, and scribe, Wilburn Mylar were located at [place].  Patient, Douglas Sims was located at home.    Nicholos Johns, PA-C    Tyler Pita, MD  [facility] Direct Dial: [phone]   Fax: [phone] [email or web address]  Skype  [email or web address]  This document serves as a record of services personally performed by Tyler Pita, MD and Freeman Caldron, PA-C. It was created on their behalf by Wilburn Mylar, a trained medical scribe. The creation of this record is based on the scribe's personal observations and the  provider's statements to them. This document has been checked and approved by the attending provider.

## 2020-09-05 ENCOUNTER — Other Ambulatory Visit: Payer: Self-pay | Admitting: Urology

## 2020-09-05 DIAGNOSIS — C61 Malignant neoplasm of prostate: Secondary | ICD-10-CM

## 2020-09-19 ENCOUNTER — Other Ambulatory Visit: Payer: Self-pay | Admitting: Urology

## 2020-09-20 NOTE — Patient Instructions (Addendum)
DUE TO COVID-19 ONLY ONE VISITOR IS ALLOWED TO COME WITH YOU AND STAY IN THE WAITING ROOM ONLY DURING PRE OP AND PROCEDURE DAY OF SURGERY. THE 2 VISITORS  MAY VISIT WITH YOU AFTER SURGERY IN YOUR PRIVATE ROOM DURING VISITING HOURS ONLY!  YOU NEED TO HAVE A COVID 19 TEST ON__6/8_____ @__11 :00_____, THIS TEST MUST BE DONE BEFORE SURGERY,  COVID TESTING SITE [place] [address], IT IS ON THE RIGHT GOING OUT [address] APPROXIMATELY  2 MINUTES PAST [employer] ON THE RIGHT. ONCE YOUR COVID TEST IS COMPLETED,  PLEASE BEGIN THE QUARANTINE INSTRUCTIONS AS OUTLINED IN YOUR HANDOUT.                Douglas Sims    Your procedure is scheduled on: 10/06/20   Report to [facility]  Entr[facility]   Report to short stay 5:15 AM     Call this number if you have problems the morning of surgery [phone]    Remember: Do not eat food or drink liquids :After Midnight.   BRUSH YOUR TEETH MORNING OF SURGERY AND RINSE YOUR MOUTH OUT, NO CHEWING GUM CANDY OR MINTS.     Take these medicines the morning of surgery with A SIP OF WATER: Xanax if needed, Tamsulosin, Omeprazole                                 You may not have any metal on your body including              piercings  Do not wear jewelry,  lotions, powders or deodorant                         Men may shave face and neck.   Do not bring valuables to the hospital. [place].  Contacts, dentures or bridgework may not be worn into surgery.                 Please read over the following fact sheets you were given: _____________________________________________________________________             [facility] - Preparing for Surgery Before surgery, you can play an important role.  Because skin is not sterile, your skin needs to be as free of germs as possible.  You can reduce the number of germs on your skin by washing with CHG (chlorahexidine  gluconate) soap before surgery.  CHG is an antiseptic cleaner which kills germs and bonds with the skin to continue killing germs even after washing. Please DO NOT use if you have an allergy to CHG or antibacterial soaps.  If your skin becomes reddened/irritated stop using the CHG and inform your nurse when you arrive at Short Stay.   You may shave your face/neck. Please follow these instructions carefully:  1.  Shower with CHG Soap the night before surgery and the  morning of Surgery.  2.  If you choose to wash your hair, wash your hair first as usual with your  normal  shampoo.  3.  After you shampoo, rinse your hair and body thoroughly to remove the  shampoo.  4.  Use CHG as you would any other liquid soap.  You can apply chg directly  to the skin and wash                       Gently with a scrungie or clean washcloth.  5.  Apply the CHG Soap to your body ONLY FROM THE NECK DOWN.   Do not use on face/ open                           Wound or open sores. Avoid contact with eyes, ears mouth and genitals (private parts).                       Wash face,  Genitals (private parts) with your normal soap.             6.  Wash thoroughly, paying special attention to the area where your surgery  will be performed.  7.  Thoroughly rinse your body with warm water from the neck down.  8.  DO NOT shower/wash with your normal soap after using and rinsing off  the CHG Soap.             9.  Pat yourself dry with a clean towel.            10.  Wear clean pajamas.            11.  Place clean sheets on your bed the night of your first shower and do not  sleep with pets. Day of Surgery : Do not apply any lotions/deodorants the morning of surgery.  Please wear clean clothes to the hospital/surgery center.  FAILURE TO FOLLOW THESE INSTRUCTIONS MAY RESULT IN THE CANCELLATION OF YOUR SURGERY PATIENT SIGNATURE_________________________________  NURSE  SIGNATURE__________________________________  ________________________________________________________________________   Douglas Sims  An incentive spirometer is a tool that can help keep your lungs clear and active. This tool measures how well you are filling your lungs with each breath. Taking long deep breaths may help reverse or decrease the chance of developing breathing (pulmonary) problems (especially infection) following:  A long period of time when you are unable to move or be active. BEFORE THE PROCEDURE   If the spirometer includes an indicator to show your best effort, your nurse or respiratory therapist will set it to a desired goal.  If possible, sit up straight or lean slightly forward. Try not to slouch.  Hold the incentive spirometer in an upright position. INSTRUCTIONS FOR USE  1. Sit on the edge of your bed if possible, or sit up as far as you can in bed or on a chair. 2. Hold the incentive spirometer in an upright position. 3. Breathe out normally. 4. Place the mouthpiece in your mouth and seal your lips tightly around it. 5. Breathe in slowly and as deeply as possible, raising the piston or the ball toward the top of the column. 6. Hold your breath for 3-5 seconds or for as long as possible. Allow the piston or ball to fall to the bottom of the column. 7. Remove the mouthpiece from your mouth and breathe out normally. 8. Rest for a few seconds and repeat Steps 1 through 7 at least 10 times every 1-2 hours when you are awake. Take your time and take a few normal breaths between deep breaths. 9. The spirometer may include an indicator to show your best effort.  Use the indicator as a goal to work toward during each repetition. 10. After each set of 10 deep breaths, practice coughing to be sure your lungs are clear. If you have an incision (the cut made at the time of surgery), support your incision when coughing by placing a pillow or rolled up towels firmly  against it. Once you are able to get out of bed, walk around indoors and cough well. You may stop using the incentive spirometer when instructed by your caregiver.  RISKS AND COMPLICATIONS  Take your time so you do not get dizzy or light-headed.  If you are in pain, you may need to take or ask for pain medication before doing incentive spirometry. It is harder to take a deep breath if you are having pain. AFTER USE  Rest and breathe slowly and easily.  It can be helpful to keep track of a log of your progress. Your caregiver can provide you with a simple table to help with this. If you are using the spirometer at home, follow these instructions: [place] IF:   You are having difficultly using the spirometer.  You have trouble using the spirometer as often as instructed.  Your pain medication is not giving enough relief while using the spirometer.  You develop fever of 100.5 F (38.1 C) or higher. SEEK IMMEDIATE MEDICAL CARE IF:   You cough up bloody sputum that had not been present before.  You develop fever of 102 F (38.9 C) or greater.  You develop worsening pain at or near the incision site. MAKE SURE YOU:   Understand these instructions.  Will watch your condition.  Will get help right away if you are not doing well or get worse. Document Released: 08/26/2006 Document Revised: 07/08/2011 Document Reviewed: 10/27/2006 [facility] Patient Information 2014 [place], [place].   ________________________________________________________________________

## 2020-09-21 ENCOUNTER — Other Ambulatory Visit: Payer: Self-pay

## 2020-09-21 ENCOUNTER — Encounter (HOSPITAL_COMMUNITY)
Admission: RE | Admit: 2020-09-21 | Discharge: 2020-09-21 | Disposition: A | Discharge status: Home / Self Care | Payer: Medicare HMO | Source: Ambulatory Visit | Attending: Urology | Admitting: Urology

## 2020-09-21 ENCOUNTER — Encounter (HOSPITAL_COMMUNITY): Payer: Self-pay

## 2020-09-21 DIAGNOSIS — Z01818 Encounter for other preprocedural examination: Secondary | ICD-10-CM | POA: Diagnosis not present

## 2020-09-21 HISTORY — DX: Anxiety disorder, unspecified: F41.9

## 2020-09-21 NOTE — Progress Notes (Addendum)
COVID Vaccine Completed:Yes Date COVID Vaccine completed:10/2019 COVID vaccine manufacturer:   Moderna     PCP - Dr. Selena Batten Cardiologist - none  Chest x-ray - no EKG - 09/21/20-epic, chart Stress Test - no ECHO - no Cardiac Cath - no Pacemaker/ICD device last checked:NA  Sleep Study - no CPAP -   Fasting Blood Sugar - NA Checks Blood Sugar _____ times a day  Blood Thinner Instructions:ASA/ Dr. Petra Kuba Aspirin Instructions:Stop 5 days prior to DOS/ Tresa Moore Last Dose:09/21/20 per Pt said he would just stop it today.  Anesthesia review:   Patient denies shortness of breath, fever, cough and chest pain at PAT appointment Yes. Pt has no SOB with any activities. He walks every day.  Patient verbalized understanding of instructions that were given to them at the PAT appointment. Patient was also instructed that they will need to review over the PAT instructions again at home before surgery.Yes EKG done today showing A-Fib. Pt has no SOB,or chest pain.  Pt called to continue his ASA and Candace Gallus PA was made aware.

## 2020-09-22 ENCOUNTER — Telehealth: Payer: Self-pay | Admitting: *Deleted

## 2020-09-22 NOTE — Telephone Encounter (Signed)
Office operator called into triage to look into getting an earlier appointment for patient (patient to see Dr. Percival Spanish 10/12/20), as the patient has a procedure on 6/10 and needs clearance. Pt has not been seen by our offices before, no appointment slots available before this date. This RN to forward message to preoperative team to advise.

## 2020-09-22 NOTE — Telephone Encounter (Signed)
   [facility] Pre-operative Risk Assessment    Patient Name: Douglas Sims  DOB: 03-25-1956  MRN: [id number]   HEARTCARE STAFF: - Please ensure there is not already an duplicate clearance open for this procedure. - Under Visit Info/Reason for Call, type in Other and utilize the format Clearance MM/DD/YY or Clearance TBD. Do not use dashes or single digits. - If request is for dental extraction, please clarify the # of teeth to be extracted.  Request for surgical clearance:  1. What type of surgery is being performed?  PROSTATECTOMY    2. When is this surgery scheduled?  10/06/2020   3. What type of clearance is required (medical clearance vs. Pharmacy clearance to hold med vs. Both)?  BOTH, AS AFIB WAS SEEN ON PT'S EKG AT PREOP,.  SENT IN A STAT REFERRAL AS PT WILL BE A NEW PT. IS SCHEDULED 6/16 FOR AN APPT.  PREOP CALL BACK HAS SENT A MESSAGE TO THE SCHEDULING TEAM TO SEE IF CAN FIND A SOONER APPT.   4. Are there any medications that need to be held prior to surgery and how long? N/A   5. Practice name and name of physician performing surgery?  [facility]    6. What is the office phone number?  [phone]   7.   What is the office fax number?  [phone]  8.   Anesthesia type (None, local, MAC, general) ?    Jeanann Lewandowsky 09/22/2020, 11:38 AM  _________________________________________________________________   (provider comments below)

## 2020-09-22 NOTE — Telephone Encounter (Signed)
Noted. Pre-op can be addressed at his appointment with Dr. Harrington Challenger on 10/04/2020.

## 2020-09-22 NOTE — Telephone Encounter (Signed)
Pt has been scheduled to see Dr. Harrington Challenger in the [place] office, 10/04/2020 at 1:20. Pt has been made aware.

## 2020-09-27 ENCOUNTER — Telehealth: Payer: Self-pay

## 2020-09-27 NOTE — Telephone Encounter (Signed)
Patient states that he was having some cramping in his hands after mowing  2 yards. Patient states that he was not having any other symptoms. Patient was advise to contact his pcp for suggestions. Patient also advise to increase his intake of water and and to rest from the heat. Patient will contact his provider to see if he can be giving anything.

## 2020-09-29 ENCOUNTER — Emergency Department (HOSPITAL_COMMUNITY): Payer: Medicare HMO

## 2020-09-29 ENCOUNTER — Encounter (HOSPITAL_COMMUNITY): Payer: Self-pay | Admitting: *Deleted

## 2020-09-29 ENCOUNTER — Other Ambulatory Visit: Payer: Self-pay

## 2020-09-29 ENCOUNTER — Emergency Department (HOSPITAL_COMMUNITY)
Admission: EM | Admit: 2020-09-29 | Discharge: 2020-09-29 | Disposition: A | Discharge status: Home / Self Care | Payer: Medicare HMO | Attending: Emergency Medicine | Admitting: Emergency Medicine

## 2020-09-29 DIAGNOSIS — Z8546 Personal history of malignant neoplasm of prostate: Secondary | ICD-10-CM | POA: Diagnosis not present

## 2020-09-29 DIAGNOSIS — Z7982 Long term (current) use of aspirin: Secondary | ICD-10-CM | POA: Diagnosis not present

## 2020-09-29 DIAGNOSIS — Z87891 Personal history of nicotine dependence: Secondary | ICD-10-CM | POA: Insufficient documentation

## 2020-09-29 DIAGNOSIS — I480 Paroxysmal atrial fibrillation: Secondary | ICD-10-CM

## 2020-09-29 DIAGNOSIS — R079 Chest pain, unspecified: Secondary | ICD-10-CM | POA: Diagnosis not present

## 2020-09-29 DIAGNOSIS — Z8551 Personal history of malignant neoplasm of bladder: Secondary | ICD-10-CM | POA: Insufficient documentation

## 2020-09-29 LAB — BASIC METABOLIC PANEL
Anion gap: 8 (ref 5–15)
BUN: 29 mg/dL — ABNORMAL HIGH (ref 8–23)
CO2: 27 mmol/L (ref 22–32)
Calcium: 8.5 mg/dL — ABNORMAL LOW (ref 8.9–10.3)
Chloride: 103 mmol/L (ref 98–111)
Creatinine, Ser: 1.23 mg/dL (ref 0.61–1.24)
GFR, Estimated: >60 mL/min (ref >60)
Glucose, Bld: 180 mg/dL — ABNORMAL HIGH (ref 70–99)
Potassium: 3.8 mmol/L (ref 3.5–5.1)
Sodium: 138 mmol/L (ref 135–145)

## 2020-09-29 LAB — CBC
HCT: 46 % (ref 39.0–52.0)
Hemoglobin: 16.1 g/dL (ref 13.0–17.0)
MCH: 30.7 pg (ref 26.0–34.0)
MCHC: 35 g/dL (ref 30.0–36.0)
MCV: 87.8 fL (ref 80.0–100.0)
Platelets: 164 10*3/uL (ref 150–400)
RBC: 5.24 MIL/uL (ref 4.22–5.81)
RDW: 12.7 % (ref 11.5–15.5)
WBC: 6.2 10*3/uL (ref 4.0–10.5)
nRBC: 0 % (ref 0.0–0.2)

## 2020-09-29 LAB — CK: Total CK: 122 U/L (ref 49–397)

## 2020-09-29 LAB — TROPONIN I (HIGH SENSITIVITY)
Troponin I (High Sensitivity): 3 ng/L (ref <18)
Troponin I (High Sensitivity): 3 ng/L (ref <18)

## 2020-09-29 MED ORDER — SODIUM CHLORIDE 0.9 % IV BOLUS
1000.0000 mL | Freq: Once | INTRAVENOUS | Status: AC
  Administered 2020-09-29: 1000 mL via INTRAVENOUS

## 2020-09-29 MED ORDER — NITROGLYCERIN 0.4 MG SL SUBL: 0.4000 mg | SUBLINGUAL_TABLET | SUBLINGUAL | 0 refills | Status: AC | PRN

## 2020-09-29 MED ORDER — METOPROLOL SUCCINATE ER 25 MG PO TB24: 25.0000 mg | ORAL_TABLET | Freq: Every day | ORAL | 0 refills | Status: DC

## 2020-09-29 NOTE — ED Provider Notes (Signed)
[facility] EMERGENCY DEPARTMENT Provider Note   CSN: [id number] Arrival date & time: 09/29/20  1146     History Chief Complaint  Patient presents with  . Chest Pain    Douglas Sims is a 65 y.o. male.  HPI      Douglas Sims is a 65 y.o. male who presents to the Emergency Department complaining of intermittent left-sided chest pain x2 weeks.  At onset, patient states that he was mowing the lawn and noticed pain to his left chest that radiated into his left arm.  He described the pain as dull and achy sensation.  He felt that he was dehydrated because he was sweaty and working in the heat.  He drank large amounts of water and Gatorade and symptoms improved, but continues to have intermittent dull pain of his left chest mostly is associated with exertion.  He had a similar episode this morning which prompted him to seek evaluation in the emergency department.  At present, he denies any chest pain or shortness of breath.   Past Medical History:  Diagnosis Date  . Anxiety   . Arthritis   . Bladder cancer ([place])   . GERD (gastroesophageal reflux disease)   . Prostate cancer ([place])   . Wears glasses     Patient Active Problem List   Diagnosis Date Noted  . Ureterocele 09/30/2019  . Prostate cancer ([place]) 09/29/2019  . Elevated PSA 08/18/2019  . Benign prostatic hyperplasia with weak urinary stream 03/05/2016  . Lung nodules 01/15/2016  . Lesion of left lobe of liver 01/03/2016  . Gallbladder polyp 01/03/2016  . Malignant neoplasm of lateral wall of urinary bladder ([place]) 12/29/2015  . CIS (carcinoma in situ of bladder) 10/13/2015  . Peyronie's disease 06/13/2015  . Helicobacter pylori infection 04/07/2009  . COLONIC POLYPS, ADENOMATOUS, HX OF 04/07/2009  . GERD 02/10/2009  . DYSPHAGIA, PHARYNGOESOPHAGEAL PHASE 02/10/2009    Past Surgical History:  Procedure Laterality Date  . BLADDER SURGERY    . COLONOSCOPY    . COLONOSCOPY N/A 01/16/2016   Procedure: COLONOSCOPY;   Surgeon: Danie Binder, MD;  [facility];  Service: Endoscopy;  Laterality: N/A;  230  . FASCIECTOMY Left 06/07/2014   Procedure: LEFT PALM FASCIECTOMY LEFT RING FINGER;  Surgeon: Daryll Brod, MD;  Location: [place];  Service: Orthopedics;  Laterality: Left;  . PLANTAR FASCIA SURGERY  2007   left foot  . PROSTATE BIOPSY    . TONSILLECTOMY    . UPPER GI ENDOSCOPY         Family History  Problem Relation Age of Onset  . Lung cancer Father   . Breast cancer Neg Hx   . Pancreatic cancer Neg Hx   . Colon cancer Neg Hx   . Prostate cancer Neg Hx     Social History   Tobacco Use  . Smoking status: Former Smoker    Packs/day: 0.50    Years: 5.00    Pack years: 2.50    Types: Cigarettes    Quit date: 06/03/1993    Years since quitting: 27.3  . Smokeless tobacco: Never Used  Vaping Use  . Vaping Use: Never used  Substance Use Topics  . Alcohol use: No  . Drug use: No    Home Medications Prior to Admission medications   Medication Sig Start Date End Date Taking? Authorizing Provider  ALPRAZolam Duanne Moron) 1 MG tablet Take 1 mg by mouth 3 (three) times daily. 06/10/19  [provider]  aspirin 81 MG EC tablet Take by mouth.    [provider]  levofloxacin (LEVAQUIN) 750 MG tablet Take 1 tablet (750 mg total) by mouth daily. To take the morning of biopsy 07/19/20 Patient not taking: No sig reported 04/20/20   Cleon Gustin, MD  meloxicam (MOBIC) 7.5 MG tablet Take 7.5 mg by mouth daily.     [provider]  omeprazole (PRILOSEC) 20 MG capsule TAKE 1 CAPSULE BY MOUTH TWICE DAILY 30  MINUTES BEFORE BREAKFAST AND DINNER FOR ACID  REFLUX. Patient taking differently: Take 20 mg by mouth 2 (two) times daily before a meal. TAKE 1 CAPSULE BY MOUTH TWICE DAILY 30  MINUTES BEFORE BREAKFAST AND DINNER FOR ACID  REFLUX. 12/08/17   Carlis Stable, NP  tamsulosin (FLOMAX) 0.4 MG CAPS capsule Take 0.4 mg by mouth daily.    [provider]    Allergies    Codeine  Review of Systems   Review of Systems  Constitutional: Negative for chills, fatigue and fever.  HENT: Negative for trouble swallowing.   Respiratory: Negative for cough and shortness of breath.   Cardiovascular: Positive for chest pain. Negative for palpitations and leg swelling.  Gastrointestinal: Negative for abdominal pain, nausea and vomiting.  Genitourinary: Negative for dysuria, flank pain and hematuria.  Musculoskeletal: Negative for arthralgias, back pain, myalgias, neck pain and neck stiffness.  Skin: Negative for rash.  Neurological: Negative for dizziness, syncope, speech difficulty, weakness and numbness.  Hematological: Does not bruise/bleed easily.    Physical Exam Updated Vital Signs BP (!) 161/97 (BP Location: Left Arm)   Pulse 83   Temp 98.2 F (36.8 C) (Oral)   Resp 18   Ht 6\' 1"  (1.854 m)   Wt 100.7 kg   SpO2 97%   BMI 29.29 kg/m   Physical Exam Vitals and nursing note reviewed.  Constitutional:      Appearance: Normal appearance. He is well-developed.  HENT:     Head: Normocephalic.     Mouth/Throat:     Mouth: Mucous membranes are moist.  Neck:     Thyroid: No thyromegaly.     Meningeal: Kernig's sign absent.  Cardiovascular:     Rate and Rhythm: Normal rate and regular rhythm.     Pulses: Normal pulses.  Pulmonary:     Effort: Pulmonary effort is normal.     Breath sounds: Normal breath sounds. No wheezing.  Abdominal:     Palpations: Abdomen is soft.     Tenderness: There is no abdominal tenderness. There is no guarding or rebound.  Musculoskeletal:        General: Normal range of motion.     Cervical back: Normal range of motion and neck supple.     Right lower leg: No edema.     Left lower leg: No edema.  Skin:    General: Skin is warm.     Capillary Refill: Capillary refill takes less than 2 seconds.     Findings: No rash.  Neurological:     General: No focal deficit present.     Mental  Status: He is alert.     Sensory: No sensory deficit.     Motor: No weakness.     ED Results / Procedures / Treatments   Labs (all labs ordered are listed, but only abnormal results are displayed) Labs Reviewed  BASIC METABOLIC PANEL - Abnormal; Notable for the following components:      Result Value  Glucose, Bld 180 (*)    BUN 29 (*)    Calcium 8.5 (*)    All other components within normal limits  CBC  CK  TROPONIN I (HIGH SENSITIVITY)  TROPONIN I (HIGH SENSITIVITY)    EKG EKG Interpretation  Date/Time:  Friday September 29 2020 12:00:02 EDT Ventricular Rate:  97 PR Interval:  156 QRS Duration: 80 QT Interval:  346 QTC Calculation: 439 R Axis:   61 Text Interpretation: Sinus rhythm with Premature atrial complexes Otherwise normal ECG Confirmed by Nanda Quinton [phone]) on 09/29/2020 12:37:05 PM   Radiology DG Chest 2 View  Result Date: 09/29/2020 CLINICAL DATA:  Intermittent chest pain for a couple weeks with left arm heaviness. EXAM: CHEST - 2 VIEW COMPARISON:  None. FINDINGS: The heart size and mediastinal contours are normal. The lungs are clear. There is no pleural effusion or pneumothorax. No acute osseous findings are identified. There are degenerative changes throughout the thoracic spine. IMPRESSION: No active cardiopulmonary process. Electronically Signed   By: Richardean Sale M.D.   On: 09/29/2020 12:22    Procedures Procedures   Medications Ordered in ED Medications  sodium chloride 0.9 % bolus 1,000 mL (0 mLs Intravenous Stopped 09/29/20 1534)    ED Course  I have reviewed the triage vital signs and the nursing notes.  Pertinent labs & imaging results that were available during my care of the patient were reviewed by me and considered in my medical decision making (see chart for details).    MDM Rules/Calculators/A&P                          Patient here with concerning history of mostly exertional chest pain.  No prior cardiac history.  Work-up today has  been reassuring.  EKG without acute ischemic change.  Delta troponin unchanged. BUN elevated, likely from dehydration, but no evidence of AKI or rhabdomyolysis.  Giving IVF's  On further review of medical records, pt had episode of atrial fib noted at pre op appt on 09/21/20 seen on EKG and was referrred to cardiology.  Has appt on 10/04/20 with Dr. Harrington Challenger.  Ekg here showing sinus rhythm.     Will consult cardiology  Consulted cardiology, Dr. Domenic Polite and discussed findings.  He recommends metoprolol XL 25 mg daily and sublingual nitro for as needed use.  Patient does have cardiology appointment scheduled for 10/04/2020 and he recommends that patient keep that appointment.  Pt agreeable to plan.  On recheck, pt reports feeling better and ready for d/c home.  Return precaution also discussed.    Final Clinical Impression(s) / ED Diagnoses Final diagnoses:  Paroxysmal atrial fibrillation [facility])    Rx / DC Orders ED Discharge Orders    None       Kem Parkinson, PA-C 10/02/20 1447    Long, Wonda Olds, MD 10/02/20 [phone]

## 2020-09-29 NOTE — ED Triage Notes (Signed)
Chest pain x 2 weeks intermittent

## 2020-09-29 NOTE — Discharge Instructions (Addendum)
Your work-up today was reassuring, but your previous EKG shows that you have an irregular rhythm of your heart.  This will need further evaluation by cardiologist.  It is important that you keep your appointment for June 8 with Dr. Harrington Challenger.  Start the metoprolol prescription today and take as directed.  I have also prescribed nitroglycerin for you to take as needed for your chest pain.  Return to the emergency department for any new or worsening symptoms.

## 2020-10-03 NOTE — Progress Notes (Signed)
Anesthesia Chart Review   Case: [id number] Date/Time: 10/06/20 0715   Procedures:      XI ROBOTIC ASSISTED LAPAROSCOPIC RADICAL PROSTATECTOMY (N/A )     LIMITED LYMPH NODE DISSECTION (Bilateral )   Anesthesia type: General   Pre-op diagnosis: PROSTATE CANCER   Location: [facility] ROOM 03 / [facility]   Surgeons: Douglas Frock, MD      DISCUSSION:64 y.o. former smoker with h/o GERD, bladder cancer, prostate cancer scheduled for above procedure 10/06/2020 with Dr. Alexis Sims.   A-fib seen on EKG at PAT visit 09/21/2020.  Will need cardiac evaluation prior to surgery.  Has been scheduled for 10/04/2020.  VS: BP 134/83   Pulse 95   Temp 36.6 C (Oral)   Resp 20   Ht 6\' 1"  (1.854 m)   Wt 98.9 kg   SpO2 97%   BMI 28.76 kg/m   PROVIDERS: Douglas School, MD is PCP    LABS: Labs reviewed: Acceptable for surgery. (all labs ordered are listed, but only abnormal results are displayed)  Labs Reviewed - No data to display   IMAGES:   EKG: 09/21/2020 Rate 97 bpm  Atrial fibrillation Abnormal ECG No previous tracing  CV:  Past Medical History:  Diagnosis Date  . Anxiety   . Arthritis   . Bladder cancer ([name])   . GERD (gastroesophageal reflux disease)   . Prostate cancer ([name])   . Wears glasses     Past Surgical History:  Procedure Laterality Date  . BLADDER SURGERY    . COLONOSCOPY    . COLONOSCOPY N/A 01/16/2016   Procedure: COLONOSCOPY;  Surgeon: Douglas Binder, MD;  [facility];  Service: Endoscopy;  Laterality: N/A;  230  . FASCIECTOMY Left 06/07/2014   Procedure: LEFT PALM FASCIECTOMY LEFT RING FINGER;  Surgeon: Douglas Brod, MD;  Location: [place];  Service: Orthopedics;  Laterality: Left;  . PLANTAR FASCIA SURGERY  2007   left foot  . PROSTATE BIOPSY    . TONSILLECTOMY    . UPPER GI ENDOSCOPY      MEDICATIONS: . ALPRAZolam (XANAX) 1 MG tablet  . aspirin 81 MG EC tablet  . levofloxacin (LEVAQUIN) 750 MG tablet  . meloxicam (MOBIC) 7.5  MG tablet  . metoprolol succinate (TOPROL-XL) 25 MG 24 hr tablet  . nitroGLYCERIN (NITROSTAT) 0.4 MG SL tablet  . omeprazole (PRILOSEC) 20 MG capsule  . tamsulosin (FLOMAX) 0.4 MG CAPS capsule   No current facility-administered medications for this encounter.     Douglas Felix, PA-C [facility] [phone]

## 2020-10-04 ENCOUNTER — Inpatient Hospital Stay (HOSPITAL_COMMUNITY): Admission: RE | Admit: 2020-10-04 | Payer: Medicare HMO | Source: Ambulatory Visit

## 2020-10-04 ENCOUNTER — Other Ambulatory Visit: Payer: Self-pay

## 2020-10-04 ENCOUNTER — Telehealth: Payer: Self-pay

## 2020-10-04 ENCOUNTER — Other Ambulatory Visit (HOSPITAL_COMMUNITY)
Admission: RE | Admit: 2020-10-04 | Discharge: 2020-10-04 | Disposition: A | Discharge status: Home / Self Care | Payer: Medicare HMO | Source: Ambulatory Visit | Attending: Urology | Admitting: Urology

## 2020-10-04 ENCOUNTER — Other Ambulatory Visit (HOSPITAL_COMMUNITY)
Admission: RE | Admit: 2020-10-04 | Discharge: 2020-10-04 | Disposition: A | Discharge status: Home / Self Care | Payer: Medicare HMO | Source: Ambulatory Visit | Attending: Internal Medicine | Admitting: Internal Medicine

## 2020-10-04 ENCOUNTER — Encounter: Payer: Self-pay | Admitting: Internal Medicine

## 2020-10-04 ENCOUNTER — Encounter (HOSPITAL_COMMUNITY)
Admission: RE | Admit: 2020-10-04 | Discharge: 2020-10-04 | Disposition: A | Discharge status: Home / Self Care | Payer: Medicare HMO | Source: Ambulatory Visit | Attending: Urology | Admitting: Urology

## 2020-10-04 ENCOUNTER — Ambulatory Visit (INDEPENDENT_AMBULATORY_CARE_PROVIDER_SITE_OTHER): Payer: Medicare HMO | Admitting: Internal Medicine

## 2020-10-04 VITALS — BP 130/80 | HR 57 | Ht 74.0 in | Wt 222.0 lb

## 2020-10-04 DIAGNOSIS — Z01812 Encounter for preprocedural laboratory examination: Secondary | ICD-10-CM | POA: Insufficient documentation

## 2020-10-04 DIAGNOSIS — R0683 Snoring: Secondary | ICD-10-CM

## 2020-10-04 DIAGNOSIS — I4891 Unspecified atrial fibrillation: Secondary | ICD-10-CM

## 2020-10-04 DIAGNOSIS — Z20822 Contact with and (suspected) exposure to covid-19: Secondary | ICD-10-CM | POA: Insufficient documentation

## 2020-10-04 HISTORY — DX: Paroxysmal atrial fibrillation: I48.0

## 2020-10-04 LAB — SARS CORONAVIRUS 2 (TAT 6-24 HRS): SARS Coronavirus 2: NEGATIVE

## 2020-10-04 LAB — TSH: TSH: 5.085 u[IU]/mL — ABNORMAL HIGH (ref 0.350–4.500)

## 2020-10-04 LAB — T4, FREE: Free T4: 0.91 ng/dL (ref 0.61–1.12)

## 2020-10-04 NOTE — Patient Instructions (Addendum)
Medication Instructions:  Your physician recommends that you continue on your current medications as directed. Please refer to the Current Medication list given to you today.  You have been cleared to proceed with surgery   *If you need a refill on your cardiac medications before your next appointment, please call your pharmacy*   Lab Work: Your physician recommends that you return for lab work in:  Today   If you have labs (blood work) drawn today and your tests are completely normal, you will receive your results only by: Marland Kitchen MyChart Message (if you have MyChart) OR . A paper copy in the mail If you have any lab test that is abnormal or we need to change your treatment, we will call you to review the results.   Testing/Procedures: Your physician has recommended that you have a sleep study. This test records several body functions during sleep, including: brain activity, eye movement, oxygen and carbon dioxide blood levels, heart rate and rhythm, breathing rate and rhythm, the flow of air through your mouth and nose, snoring, body muscle movements, and chest and belly movement.  Your physician has requested that you have an echocardiogram. Echocardiography is a painless test that uses sound waves to create images of your heart. It provides your doctor with information about the size and shape of your heart and how well your heart's chambers and valves are working. This procedure takes approximately one hour. There are no restrictions for this procedure.   Follow-Up: At [facility], you and your health needs are our priority.  As part of our continuing mission to provide you with exceptional heart care, we have created designated Provider Care Teams.  These Care Teams include your primary Cardiologist (physician) and Advanced Practice Providers (APPs -  Physician Assistants and Nurse Practitioners) who all work together to provide you with the care you need, when you need it.  We recommend  signing up for the patient portal called "MyChart".  Sign up information is provided on this After Visit Summary.  MyChart is used to connect with patients for Virtual Visits (Telemedicine).  Patients are able to view lab/test results, encounter notes, upcoming appointments, etc.  Non-urgent messages can be sent to your provider as well.   To learn more about what you can do with MyChart, go to [email or web address].    Your next appointment:    Pending test results   The format for your next appointment:   In Person  Provider:   Dorris Carnes, MD   Other Instructions Thank you for choosing [facility]!

## 2020-10-04 NOTE — Progress Notes (Signed)
Cardiology Office Note   Date:  10/04/2020   [name], DOB [birth year], MRN [id number]  PCP:  Redmond School, MD  Cardiologist:   Dorris Carnes, MD   Pt referred for preop risk assessment and for eval of PAF   History of Present Illness: Douglas Sims is a 64 y.o. male with a history of HTN, GERD and  prostate cancer   Atherosclerosis of aorta noted on CT of body   Being evaluated for surgery   EKG on 5/26 in preop clinic showed atrial fibrillaton    The pt went to ED on 6/3 for diffuse cramping   Hte says he was outside mowing multiple lawns    Started getting cramping on both sides of chest, legs    Went to ED  EKG done showed SR with PACs   Trop neg.  He was given IV fluids and said he flet better      Since that time he has not mowed any lawns but with normal acitvity he has felt OK   Again, no palpitations  No SOB   No CP   The pt normally walks about 5 miles per day  No problems  No dizzienss   No SOB      Diet  Admits to drinking sweet tea (a lot)   Trying to watch food, eat better otherwise    Current Meds  Medication Sig  . ALPRAZolam (XANAX) 1 MG tablet Take 1 mg by mouth 3 (three) times daily.  Marland Kitchen aspirin 81 MG EC tablet Take by mouth.  . meloxicam (MOBIC) 7.5 MG tablet Take 7.5 mg by mouth daily.   . metoprolol succinate (TOPROL-XL) 25 MG 24 hr tablet Take 1 tablet (25 mg total) by mouth daily.  . nitroGLYCERIN (NITROSTAT) 0.4 MG SL tablet Place 1 tablet (0.4 mg total) under the tongue every 5 (five) minutes as needed for chest pain.  Marland Kitchen omeprazole (PRILOSEC) 20 MG capsule TAKE 1 CAPSULE BY MOUTH TWICE DAILY 30  MINUTES BEFORE BREAKFAST AND DINNER FOR ACID  REFLUX. (Patient taking differently: TAKE 1 CAPSULE BY MOUTH TWICE DAILY 30  MINUTES BEFORE BREAKFAST AND DINNER FOR ACID  REFLUX.)  . tamsulosin (FLOMAX) 0.4 MG CAPS capsule Take 0.4 mg by mouth daily.     Allergies:   Codeine   Past Medical History:  Diagnosis Date  . Anxiety   . Arthritis   .  Bladder cancer ([place])   . GERD (gastroesophageal reflux disease)   . Prostate cancer ([place])   . Wears glasses     Past Surgical History:  Procedure Laterality Date  . BLADDER SURGERY    . COLONOSCOPY    . COLONOSCOPY N/A 01/16/2016   Procedure: COLONOSCOPY;  Surgeon: Danie Binder, MD;  [facility];  Service: Endoscopy;  Laterality: N/A;  230  . FASCIECTOMY Left 06/07/2014   Procedure: LEFT PALM FASCIECTOMY LEFT RING FINGER;  Surgeon: Daryll Brod, MD;  Location: [place];  Service: Orthopedics;  Laterality: Left;  . PLANTAR FASCIA SURGERY  2007   left foot  . PROSTATE BIOPSY    . TONSILLECTOMY    . UPPER GI ENDOSCOPY       Social History:  The patient  reports that he quit smoking about 27 years ago. His smoking use included cigarettes. He has a 2.50 pack-year smoking history. He has never used smokeless tobacco. He reports that he does not drink alcohol and does not use drugs.  Family History:  The patient's family history includes Lung cancer in his father.    ROS:  Please see the history of present illness. All other systems are reviewed and  Negative to the above problem except as noted.    PHYSICAL EXAM: VS:  BP 130/80 (BP Location: Left Arm, Patient Position: Sitting, Cuff Size: Normal)   Pulse (!) 57   Ht 6\' 2"  (1.88 m)   Wt 222 lb (100.7 kg)   SpO2 98%   BMI 28.50 kg/m   GEN: OVerweight 65 yo  in no acute distress  HEENT: normal  Neck: no JVD, carotid bruits  Neck is full  Cardiac: RRR; no murmurs, No LE   edema  Respiratory:  clear to auscultation bilaterally,  GI: soft, nontender, nondistended, + BS  No hepatomegaly  MS: no deformity Moving all extremities   Skin: warm and dry, no rash Neuro:  Strength and sensation are intact Psych: euthymic mood, full affect   EKG:  EKG is not ordered today.  Report previous above from 6/3 and 5/26     Lipid Panel No results found for: CHOL, TRIG, HDL, CHOLHDL, VLDL, LDLCALC, LDLDIRECT     Wt Readings from Last 3 Encounters:  10/04/20 222 lb (100.7 kg)  10/04/20 220 lb (99.8 kg)  09/29/20 222 lb (100.7 kg)      ASSESSMENT AND PLAN:  1   PAF   Pt has newly documented atrial fibrillatoin    Seen on EKG from preop clinc on 5/26   He does not sense   EKG from 6/3 showed SR   His CHADS2VASc is 1  (age)   I have reviewed CT from past of abdomen.   Minimal calcification reported   Will review with radiology     Recomm:   Will get TSH   Also set up for echo   This can be done after surgery    The pt says he snores but denies day time somnolence   Still would set up for home sleep study.    Wil lreview CT with radiology    Keep on tele during periop period   Indeed may have exacerbation during that time with afib   2   Chest discomfort   The pt went to the ED on 6/3   Says he felt like he was having a heat stroke   Had diffuse cramping   Chest pain was part of this    Trop were neg.    Improved with hydration      I agree    I would follow   I am not convinced cardiac      Note CT of body did not show any signif coronary calcifications     3  Preop risk assessment   From a cardiac standpont I think he is a low risk for an ischemic complication with surgery   He may be at increased risk for atrial fib in the perop period    I would keep on tele  This can be controlled with IV or po meds     Should not preclude surgery  4  Obesity  Discussed diet   He will stop sweet tea..  Will be in touch with pt re tests results    Will be available as a service line during the periop period if needed   Please call       Current medicines are reviewed at length with the patient today.  The patient does  not have concerns regarding medicines.  Signed, Dorris Carnes, MD  10/04/2020 1:21 PM    [facility] [place], [place], [place]  [address] Phone: [phone]; Fax: [phone]

## 2020-10-04 NOTE — Telephone Encounter (Signed)
-----   Message from Fay Records, MD sent at 10/04/2020  4:21 PM EDT ----- TSH is minimally elevated   may not be significant but I would recomm he get a free T3, free T4 to fully assess thyroid function

## 2020-10-04 NOTE — Telephone Encounter (Signed)
Pt notified and voiced understanding. Orders placed for lab work.

## 2020-10-05 ENCOUNTER — Encounter (HOSPITAL_COMMUNITY): Payer: Self-pay

## 2020-10-05 LAB — T3, FREE: T3, Free: 3.3 pg/mL (ref 2.0–4.4)

## 2020-10-05 NOTE — Anesthesia Preprocedure Evaluation (Addendum)
Anesthesia Evaluation  Patient identified by MRN, date of birth, ID band Patient awake    Reviewed: Allergy & Precautions, NPO status , Patient's Chart, lab work & pertinent test results  History of Anesthesia Complications Negative for: history of anesthetic complications  Airway Mallampati: II  TM Distance: >3 FB Neck ROM: Full    Dental  (+) Dental Advisory Given, Partial Lower, Partial Upper   Pulmonary neg pulmonary ROS, former smoker,    Pulmonary exam normal        Cardiovascular Normal cardiovascular exam+ dysrhythmias Atrial Fibrillation      Neuro/Psych PSYCHIATRIC DISORDERS Anxiety negative neurological ROS     GI/Hepatic Neg liver ROS, GERD  ,  Endo/Other  negative endocrine ROS  Renal/GU      Musculoskeletal negative musculoskeletal ROS (+)   Abdominal   Peds  Hematology negative hematology ROS (+)   Anesthesia Other Findings   Reproductive/Obstetrics                          Anesthesia Physical Anesthesia Plan  ASA: 2  Anesthesia Plan: General   Post-op Pain Management:    Induction: Intravenous  PONV Risk Score and Plan: 4 or greater and Ondansetron, Dexamethasone, Midazolam and Scopolamine patch - Pre-op  Airway Management Planned: Oral ETT  Additional Equipment:   Intra-op Plan:   Post-operative Plan: Extubation in OR  Informed Consent: I have reviewed the patients History and Physical, chart, labs and discussed the procedure including the risks, benefits and alternatives for the proposed anesthesia with the patient or authorized representative who has indicated his/her understanding and acceptance.     Dental advisory given  Plan Discussed with: Anesthesiologist and CRNA  Anesthesia Plan Comments: (See APP note by Durel Salts, FNP )      Anesthesia Quick Evaluation

## 2020-10-05 NOTE — Progress Notes (Signed)
Anesthesia Chart Review:   Case: [id number] Date/Time: 10/06/20 0715   Procedures:      XI ROBOTIC ASSISTED LAPAROSCOPIC RADICAL PROSTATECTOMY     LIMITED LYMPH NODE DISSECTION (Bilateral)   Anesthesia type: General   Pre-op diagnosis: PROSTATE CANCER   Location: [facility] ROOM 03 / [facility]   Surgeons: [name], MD       DISCUSSION: Pt is 65 years old with hx PAF, prostate cancer, bladder cancer  Was seen in pre-surgical testing 09/21/20 and EKG that day showed new afib (no prior hx of afib).   ED visit 09/29/20 for diffuse cramping including chest cramping after mowing multiple lawns. Sx improved after IVF. Troponin negative, EKG showed SR with PACs.   VS: Ht 6\' 2"  (1.88 m)   Wt 99.8 kg   BMI 28.25 kg/m   PROVIDERS: - PCP is [employer]mond [employer], MD - Cardiologist is Dorris Carnes, MD. Initial visit 10/04/20. Cleared pt for surgery at low risk for ischemic complication. She notes "He may be at increased risk for atrial fib in the perop period    I would keep on tele  This can be controlled with IV or po meds     Should not preclude surgery"   LABS:  - BMP 09/29/20 showed glucose 180, BUN29, Ca 8.5. Otherwise normal - CBC 09/29/20 was normal - Labs acceptable for surgery   IMAGES: CXR 09/29/20: No active cardiopulmonary process.   EKG 09/29/20: SR. PACs.    CV: N/A Echo ordered to be completed after surgery   Past Medical History:  Diagnosis Date   Anxiety    Arthritis    Bladder cancer ([place])    GERD (gastroesophageal reflux disease)    Prostate cancer ([place])    Wears glasses     Past Surgical History:  Procedure Laterality Date   BLADDER SURGERY     COLONOSCOPY     COLONOSCOPY N/A 01/16/2016   Procedure: COLONOSCOPY;  Surgeon: Danie Binder, MD;  [facility];  Service: Endoscopy;  Laterality: N/A;  230   FASCIECTOMY Left 06/07/2014   Procedure: LEFT PALM FASCIECTOMY LEFT RING FINGER;  Surgeon: Daryll Brod, MD;  Location: [facility];  Service:  Orthopedics;  Laterality: Left;   PLANTAR FASCIA SURGERY  2007   left foot   PROSTATE BIOPSY     TONSILLECTOMY     UPPER GI ENDOSCOPY      MEDICATIONS:  ALPRAZolam (XANAX) 1 MG tablet   aspirin 81 MG EC tablet   meloxicam (MOBIC) 7.5 MG tablet   metoprolol succinate (TOPROL-XL) 25 MG 24 hr tablet   nitroGLYCERIN (NITROSTAT) 0.4 MG SL tablet   omeprazole (PRILOSEC) 20 MG capsule   tamsulosin (FLOMAX) 0.4 MG CAPS capsule   No current facility-administered medications for this encounter.    If no changes, I anticipate pt can proceed with surgery as scheduled.   Willeen Cass, PhD, FNP-BC [facility]/Anesthesiology Phone: [phone] 10/05/2020 9:19 AM

## 2020-10-06 ENCOUNTER — Encounter (HOSPITAL_COMMUNITY): Payer: Self-pay | Admitting: Urology

## 2020-10-06 ENCOUNTER — Encounter (HOSPITAL_COMMUNITY)
Admission: RE | Disposition: A | Discharge status: Home / Self Care | Payer: Self-pay | Source: Home / Self Care | Attending: Urology

## 2020-10-06 ENCOUNTER — Ambulatory Visit (HOSPITAL_COMMUNITY): Payer: Medicare HMO | Admitting: Physician Assistant

## 2020-10-06 ENCOUNTER — Observation Stay (HOSPITAL_COMMUNITY)
Admission: RE | Admit: 2020-10-06 | Discharge: 2020-10-07 | Disposition: A | Discharge status: Home / Self Care | Payer: Medicare HMO | Attending: Urology | Admitting: Urology

## 2020-10-06 ENCOUNTER — Ambulatory Visit (HOSPITAL_COMMUNITY): Payer: Medicare HMO | Admitting: Certified Registered Nurse Anesthetist

## 2020-10-06 DIAGNOSIS — C672 Malignant neoplasm of lateral wall of bladder: Secondary | ICD-10-CM | POA: Diagnosis not present

## 2020-10-06 DIAGNOSIS — D360 Benign neoplasm of lymph nodes: Secondary | ICD-10-CM | POA: Diagnosis not present

## 2020-10-06 DIAGNOSIS — Z7982 Long term (current) use of aspirin: Secondary | ICD-10-CM | POA: Diagnosis not present

## 2020-10-06 DIAGNOSIS — I480 Paroxysmal atrial fibrillation: Secondary | ICD-10-CM | POA: Diagnosis not present

## 2020-10-06 DIAGNOSIS — C61 Malignant neoplasm of prostate: Secondary | ICD-10-CM | POA: Diagnosis not present

## 2020-10-06 DIAGNOSIS — N4289 Other specified disorders of prostate: Secondary | ICD-10-CM | POA: Diagnosis not present

## 2020-10-06 DIAGNOSIS — Z8551 Personal history of malignant neoplasm of bladder: Secondary | ICD-10-CM | POA: Insufficient documentation

## 2020-10-06 DIAGNOSIS — Z87891 Personal history of nicotine dependence: Secondary | ICD-10-CM | POA: Diagnosis not present

## 2020-10-06 DIAGNOSIS — K219 Gastro-esophageal reflux disease without esophagitis: Secondary | ICD-10-CM | POA: Diagnosis not present

## 2020-10-06 DIAGNOSIS — Z79899 Other long term (current) drug therapy: Secondary | ICD-10-CM | POA: Insufficient documentation

## 2020-10-06 HISTORY — PX: LYMPH NODE DISSECTION: SHX5087

## 2020-10-06 HISTORY — PX: ROBOT ASSISTED LAPAROSCOPIC RADICAL PROSTATECTOMY: SHX5141

## 2020-10-06 LAB — TYPE AND SCREEN
ABO/RH(D): A POS
Antibody Screen: NEGATIVE

## 2020-10-06 LAB — HEMOGLOBIN AND HEMATOCRIT, BLOOD
HCT: 42.5 % (ref 39.0–52.0)
Hemoglobin: 14.6 g/dL (ref 13.0–17.0)

## 2020-10-06 LAB — ABO/RH: ABO/RH(D): A POS

## 2020-10-06 SURGERY — PROSTATECTOMY, RADICAL, ROBOT-ASSISTED, LAPAROSCOPIC
Anesthesia: General

## 2020-10-06 MED ORDER — AMISULPRIDE (ANTIEMETIC) 5 MG/2ML IV SOLN: 10.0000 mg | Freq: Once | INTRAVENOUS | Status: DC | PRN

## 2020-10-06 MED ORDER — FENTANYL CITRATE (PF) 100 MCG/2ML IJ SOLN
INTRAMUSCULAR | Status: AC
  Administered 2020-10-06: 25 ug via INTRAVENOUS
  Filled 2020-10-06: qty 2

## 2020-10-06 MED ORDER — DOCUSATE SODIUM 100 MG PO CAPS
100.0000 mg | ORAL_CAPSULE | Freq: Two times a day (BID) | ORAL | Status: DC
  Administered 2020-10-06 – 2020-10-07 (×2): 100 mg via ORAL
  Filled 2020-10-06 (×2): qty 1

## 2020-10-06 MED ORDER — ROCURONIUM BROMIDE 10 MG/ML (PF) SYRINGE
PREFILLED_SYRINGE | INTRAVENOUS | Status: DC | PRN
  Administered 2020-10-06 (×2): 20 mg via INTRAVENOUS
  Administered 2020-10-06: 100 mg via INTRAVENOUS

## 2020-10-06 MED ORDER — ALBUMIN HUMAN 5 % IV SOLN: INTRAVENOUS | Status: DC | PRN

## 2020-10-06 MED ORDER — ONDANSETRON HCL 4 MG/2ML IJ SOLN: 4.0000 mg | INTRAMUSCULAR | Status: DC | PRN

## 2020-10-06 MED ORDER — LIDOCAINE HCL 2 % IJ SOLN
INTRAMUSCULAR | Status: AC
  Filled 2020-10-06: qty 20

## 2020-10-06 MED ORDER — SULFAMETHOXAZOLE-TRIMETHOPRIM 800-160 MG PO TABS: 1.0000 | ORAL_TABLET | Freq: Every day | ORAL | 0 refills | Status: DC

## 2020-10-06 MED ORDER — ONDANSETRON HCL 4 MG/2ML IJ SOLN
INTRAMUSCULAR | Status: DC | PRN
  Administered 2020-10-06: 4 mg via INTRAVENOUS

## 2020-10-06 MED ORDER — ORAL CARE MOUTH RINSE: 15.0000 mL | Freq: Once | OROMUCOSAL | Status: AC

## 2020-10-06 MED ORDER — FENTANYL CITRATE (PF) 250 MCG/5ML IJ SOLN
INTRAMUSCULAR | Status: AC
  Filled 2020-10-06: qty 5

## 2020-10-06 MED ORDER — SODIUM CHLORIDE (PF) 0.9 % IJ SOLN
INTRAMUSCULAR | Status: DC | PRN
  Administered 2020-10-06: 20 mL

## 2020-10-06 MED ORDER — SUGAMMADEX SODIUM 500 MG/5ML IV SOLN
INTRAVENOUS | Status: AC
  Filled 2020-10-06: qty 5

## 2020-10-06 MED ORDER — ALBUMIN HUMAN 5 % IV SOLN
INTRAVENOUS | Status: AC
  Filled 2020-10-06: qty 250

## 2020-10-06 MED ORDER — ESMOLOL HCL 100 MG/10ML IV SOLN
INTRAVENOUS | Status: DC | PRN
  Administered 2020-10-06: 25 mg via INTRAVENOUS

## 2020-10-06 MED ORDER — DOCUSATE SODIUM 100 MG PO CAPS: 100.0000 mg | ORAL_CAPSULE | Freq: Two times a day (BID) | ORAL | Status: AC

## 2020-10-06 MED ORDER — SUGAMMADEX SODIUM 200 MG/2ML IV SOLN
INTRAVENOUS | Status: DC | PRN
  Administered 2020-10-06: 220 mg via INTRAVENOUS

## 2020-10-06 MED ORDER — METOPROLOL SUCCINATE ER 25 MG PO TB24
25.0000 mg | ORAL_TABLET | Freq: Every day | ORAL | Status: DC
  Administered 2020-10-06: 25 mg via ORAL
  Filled 2020-10-06: qty 1

## 2020-10-06 MED ORDER — PROMETHAZINE HCL 25 MG/ML IJ SOLN: 6.2500 mg | INTRAMUSCULAR | Status: DC | PRN

## 2020-10-06 MED ORDER — ONDANSETRON HCL 4 MG/2ML IJ SOLN
INTRAMUSCULAR | Status: AC
  Filled 2020-10-06: qty 2

## 2020-10-06 MED ORDER — ACETAMINOPHEN 500 MG PO TABS
1000.0000 mg | ORAL_TABLET | Freq: Four times a day (QID) | ORAL | Status: AC
  Administered 2020-10-06 – 2020-10-07 (×4): 1000 mg via ORAL
  Filled 2020-10-06 (×4): qty 2

## 2020-10-06 MED ORDER — STERILE WATER FOR IRRIGATION IR SOLN
Status: DC | PRN
  Administered 2020-10-06: 1000 mL

## 2020-10-06 MED ORDER — SCOPOLAMINE 1 MG/3DAYS TD PT72
1.0000 | MEDICATED_PATCH | TRANSDERMAL | Status: DC
  Administered 2020-10-06: 1.5 mg via TRANSDERMAL
  Filled 2020-10-06: qty 1

## 2020-10-06 MED ORDER — SODIUM CHLORIDE (PF) 0.9 % IJ SOLN
INTRAMUSCULAR | Status: AC
  Filled 2020-10-06: qty 20

## 2020-10-06 MED ORDER — ROCURONIUM BROMIDE 10 MG/ML (PF) SYRINGE
PREFILLED_SYRINGE | INTRAVENOUS | Status: AC
  Filled 2020-10-06: qty 10

## 2020-10-06 MED ORDER — DIPHENHYDRAMINE HCL 50 MG/ML IJ SOLN: 12.5000 mg | Freq: Four times a day (QID) | INTRAMUSCULAR | Status: DC | PRN

## 2020-10-06 MED ORDER — DILTIAZEM LOAD VIA INFUSION
10.0000 mg | Freq: Once | INTRAVENOUS | Status: DC
  Filled 2020-10-06: qty 10

## 2020-10-06 MED ORDER — SULFAMETHOXAZOLE-TRIMETHOPRIM 800-160 MG PO TABS
1.0000 | ORAL_TABLET | Freq: Every day | ORAL | Status: DC
  Administered 2020-10-06 – 2020-10-07 (×2): 1 via ORAL
  Filled 2020-10-06 (×2): qty 1

## 2020-10-06 MED ORDER — BACITRACIN-NEOMYCIN-POLYMYXIN 400-5-5000 EX OINT: 1.0000 "application " | TOPICAL_OINTMENT | Freq: Three times a day (TID) | CUTANEOUS | Status: DC | PRN

## 2020-10-06 MED ORDER — CHLORHEXIDINE GLUCONATE 0.12 % MT SOLN
15.0000 mL | Freq: Once | OROMUCOSAL | Status: AC
  Administered 2020-10-06: 15 mL via OROMUCOSAL

## 2020-10-06 MED ORDER — ALPRAZOLAM 1 MG PO TABS
1.0000 mg | ORAL_TABLET | Freq: Three times a day (TID) | ORAL | Status: DC
  Administered 2020-10-06 – 2020-10-07 (×4): 1 mg via ORAL
  Filled 2020-10-06 (×4): qty 1

## 2020-10-06 MED ORDER — BELLADONNA ALKALOIDS-OPIUM 16.2-60 MG RE SUPP: 1.0000 | Freq: Four times a day (QID) | RECTAL | Status: DC | PRN

## 2020-10-06 MED ORDER — ESMOLOL HCL 100 MG/10ML IV SOLN
INTRAVENOUS | Status: AC
  Filled 2020-10-06: qty 10

## 2020-10-06 MED ORDER — HYDROCODONE-ACETAMINOPHEN 5-325 MG PO TABS: 1.0000 | ORAL_TABLET | Freq: Four times a day (QID) | ORAL | 0 refills | Status: DC | PRN

## 2020-10-06 MED ORDER — DEXAMETHASONE SODIUM PHOSPHATE 10 MG/ML IJ SOLN
INTRAMUSCULAR | Status: AC
  Filled 2020-10-06: qty 1

## 2020-10-06 MED ORDER — DILTIAZEM HCL-DEXTROSE 125-5 MG/125ML-% IV SOLN (PREMIX)
5.0000 mg/h | INTRAVENOUS | Status: DC
  Filled 2020-10-06: qty 125

## 2020-10-06 MED ORDER — DIPHENHYDRAMINE HCL 12.5 MG/5ML PO ELIX
12.5000 mg | ORAL_SOLUTION | Freq: Four times a day (QID) | ORAL | Status: DC | PRN
  Administered 2020-10-06: 12.5 mg via ORAL
  Filled 2020-10-06: qty 5

## 2020-10-06 MED ORDER — PHENYLEPHRINE 40 MCG/ML (10ML) SYRINGE FOR IV PUSH (FOR BLOOD PRESSURE SUPPORT)
PREFILLED_SYRINGE | INTRAVENOUS | Status: DC | PRN
  Administered 2020-10-06 (×3): 80 ug via INTRAVENOUS
  Administered 2020-10-06: 40 ug via INTRAVENOUS
  Administered 2020-10-06: 80 ug via INTRAVENOUS

## 2020-10-06 MED ORDER — DEXTROSE-NACL 5-0.45 % IV SOLN: INTRAVENOUS | Status: DC

## 2020-10-06 MED ORDER — ACETAMINOPHEN 500 MG PO TABS
1000.0000 mg | ORAL_TABLET | Freq: Once | ORAL | Status: AC
  Administered 2020-10-06: 1000 mg via ORAL
  Filled 2020-10-06: qty 2

## 2020-10-06 MED ORDER — MIDAZOLAM HCL 2 MG/2ML IJ SOLN
INTRAMUSCULAR | Status: AC
  Filled 2020-10-06: qty 2

## 2020-10-06 MED ORDER — HYDROMORPHONE HCL 1 MG/ML IJ SOLN: 0.5000 mg | INTRAMUSCULAR | Status: DC | PRN

## 2020-10-06 MED ORDER — CEFAZOLIN SODIUM-DEXTROSE 2-4 GM/100ML-% IV SOLN
2.0000 g | INTRAVENOUS | Status: AC
  Administered 2020-10-06: 2 g via INTRAVENOUS
  Filled 2020-10-06: qty 100

## 2020-10-06 MED ORDER — LIDOCAINE 2% (20 MG/ML) 5 ML SYRINGE
INTRAMUSCULAR | Status: DC | PRN
  Administered 2020-10-06: 80 mg via INTRAVENOUS

## 2020-10-06 MED ORDER — MIDAZOLAM HCL 5 MG/5ML IJ SOLN
INTRAMUSCULAR | Status: DC | PRN
  Administered 2020-10-06: 2 mg via INTRAVENOUS

## 2020-10-06 MED ORDER — NITROGLYCERIN 0.4 MG SL SUBL: 0.4000 mg | SUBLINGUAL_TABLET | SUBLINGUAL | Status: DC | PRN

## 2020-10-06 MED ORDER — PROPOFOL 10 MG/ML IV BOLUS
INTRAVENOUS | Status: AC
  Filled 2020-10-06: qty 20

## 2020-10-06 MED ORDER — PANTOPRAZOLE SODIUM 40 MG PO TBEC
40.0000 mg | DELAYED_RELEASE_TABLET | Freq: Every day | ORAL | Status: DC
  Administered 2020-10-07: 40 mg via ORAL
  Filled 2020-10-06: qty 1

## 2020-10-06 MED ORDER — LACTATED RINGERS IV SOLN: INTRAVENOUS | Status: DC | PRN

## 2020-10-06 MED ORDER — OXYCODONE HCL 5 MG PO TABS: 5.0000 mg | ORAL_TABLET | ORAL | Status: DC | PRN

## 2020-10-06 MED ORDER — LACTATED RINGERS IR SOLN
Status: DC | PRN
  Administered 2020-10-06: 1

## 2020-10-06 MED ORDER — DILTIAZEM HCL 25 MG/5ML IV SOLN: 10.0000 mg | Freq: Once | INTRAVENOUS | Status: DC

## 2020-10-06 MED ORDER — EPHEDRINE SULFATE-NACL 50-0.9 MG/10ML-% IV SOSY
PREFILLED_SYRINGE | INTRAVENOUS | Status: DC | PRN
  Administered 2020-10-06: 5 mg via INTRAVENOUS

## 2020-10-06 MED ORDER — FENTANYL CITRATE (PF) 100 MCG/2ML IJ SOLN
25.0000 ug | INTRAMUSCULAR | Status: DC | PRN
  Administered 2020-10-06: 50 ug via INTRAVENOUS
  Administered 2020-10-06: 25 ug via INTRAVENOUS

## 2020-10-06 MED ORDER — LIDOCAINE 2% (20 MG/ML) 5 ML SYRINGE
INTRAMUSCULAR | Status: AC
  Filled 2020-10-06: qty 5

## 2020-10-06 MED ORDER — PROPOFOL 10 MG/ML IV BOLUS
INTRAVENOUS | Status: DC | PRN
  Administered 2020-10-06: 200 mg via INTRAVENOUS

## 2020-10-06 MED ORDER — CELECOXIB 200 MG PO CAPS
200.0000 mg | ORAL_CAPSULE | Freq: Once | ORAL | Status: AC
  Administered 2020-10-06: 200 mg via ORAL
  Filled 2020-10-06: qty 1

## 2020-10-06 MED ORDER — FENTANYL CITRATE (PF) 100 MCG/2ML IJ SOLN
INTRAMUSCULAR | Status: DC | PRN
  Administered 2020-10-06: 100 ug via INTRAVENOUS

## 2020-10-06 MED ORDER — SODIUM CHLORIDE 0.9 % IV BOLUS
1000.0000 mL | Freq: Once | INTRAVENOUS | Status: AC
  Administered 2020-10-06: 1000 mL via INTRAVENOUS

## 2020-10-06 MED ORDER — BUPIVACAINE LIPOSOME 1.3 % IJ SUSP
20.0000 mL | Freq: Once | INTRAMUSCULAR | Status: AC
  Administered 2020-10-06: 20 mL
  Filled 2020-10-06: qty 20

## 2020-10-06 MED ORDER — DILTIAZEM LOAD VIA INFUSION: 10.0000 mg | Freq: Once | INTRAVENOUS | Status: DC

## 2020-10-06 MED ORDER — LIDOCAINE 20MG/ML (2%) 15 ML SYRINGE OPTIME
INTRAMUSCULAR | Status: DC | PRN
  Administered 2020-10-06: 1.5 mg/kg/h via INTRAVENOUS

## 2020-10-06 MED ORDER — LACTATED RINGERS IV SOLN: INTRAVENOUS | Status: DC

## 2020-10-06 MED ORDER — DEXAMETHASONE SODIUM PHOSPHATE 10 MG/ML IJ SOLN
INTRAMUSCULAR | Status: DC | PRN
  Administered 2020-10-06: 8 mg via INTRAVENOUS

## 2020-10-06 SURGICAL SUPPLY — 70 items
ADH SKN CLS APL DERMABOND .7 (GAUZE/BANDAGES/DRESSINGS) ×2
APL PRP STRL LF DISP 70% ISPRP (MISCELLANEOUS) ×2
APL SWBSTK 6 STRL LF DISP (MISCELLANEOUS) ×2
APPLICATOR COTTON TIP 6 STRL (MISCELLANEOUS) ×2 IMPLANT
APPLICATOR COTTON TIP 6IN STRL (MISCELLANEOUS) ×3
CATH FOLEY 2WAY SLVR 18FR 30CC (CATHETERS) ×3 IMPLANT
CATH TIEMANN FOLEY 18FR 5CC (CATHETERS) ×3 IMPLANT
CHLORAPREP W/TINT 26 (MISCELLANEOUS) ×3 IMPLANT
CLIP VESOLOCK LG 6/CT PURPLE (CLIP) ×6 IMPLANT
CNTNR URN SCR LID CUP LEK RST (MISCELLANEOUS) ×2 IMPLANT
CONT SPEC 4OZ STRL OR WHT (MISCELLANEOUS) ×3
COVER SURGICAL LIGHT HANDLE (MISCELLANEOUS) ×3 IMPLANT
COVER TIP SHEARS 8 DVNC (MISCELLANEOUS) ×2 IMPLANT
COVER TIP SHEARS 8MM DA VINCI (MISCELLANEOUS) ×3
COVER WAND RF STERILE (DRAPES) IMPLANT
CUTTER ECHEON FLEX ENDO 45 340 (ENDOMECHANICALS) ×3 IMPLANT
DECANTER SPIKE VIAL GLASS SM (MISCELLANEOUS) ×3 IMPLANT
DERMABOND ADVANCED (GAUZE/BANDAGES/DRESSINGS) ×1
DERMABOND ADVANCED .7 DNX12 (GAUZE/BANDAGES/DRESSINGS) ×2 IMPLANT
DRAIN CHANNEL RND F F (WOUND CARE) IMPLANT
DRAPE ARM DVNC X/XI (DISPOSABLE) ×8 IMPLANT
DRAPE COLUMN DVNC XI (DISPOSABLE) ×2 IMPLANT
DRAPE DA VINCI XI ARM (DISPOSABLE) ×12
DRAPE DA VINCI XI COLUMN (DISPOSABLE) ×3
DRAPE SURG IRRIG POUCH 19X23 (DRAPES) ×3 IMPLANT
DRSG TEGADERM 4X4.75 (GAUZE/BANDAGES/DRESSINGS) ×3 IMPLANT
ELECT PENCIL ROCKER SW 15FT (MISCELLANEOUS) IMPLANT
ELECT REM PT RETURN 15FT ADLT (MISCELLANEOUS) ×3 IMPLANT
GAUZE 4X4 16PLY RFD (DISPOSABLE) IMPLANT
GAUZE SPONGE 2X2 8PLY STRL LF (GAUZE/BANDAGES/DRESSINGS) IMPLANT
GLOVE SURG ENC MOIS LTX SZ6.5 (GLOVE) ×3 IMPLANT
GLOVE SURG ENC TEXT LTX SZ7.5 (GLOVE) ×6 IMPLANT
GLOVE SURG UNDER POLY LF SZ7.5 (GLOVE) ×3 IMPLANT
GOWN STRL REUS W/TWL LRG LVL3 (GOWN DISPOSABLE) ×9 IMPLANT
HOLDER FOLEY CATH W/STRAP (MISCELLANEOUS) ×3 IMPLANT
IRRIG SUCT STRYKERFLOW 2 WTIP (MISCELLANEOUS) ×3
IRRIGATION SUCT STRKRFLW 2 WTP (MISCELLANEOUS) ×2 IMPLANT
IV LACTATED RINGERS 1000ML (IV SOLUTION) ×3 IMPLANT
KIT PROCEDURE DA VINCI SI (MISCELLANEOUS) ×3
KIT PROCEDURE DVNC SI (MISCELLANEOUS) ×2 IMPLANT
KIT TURNOVER KIT A (KITS) ×3 IMPLANT
NDL INSUFFLATION 14GA 120MM (NEEDLE) ×2 IMPLANT
NDL SPNL 22GX7 QUINCKE BK (NEEDLE) ×2 IMPLANT
NEEDLE INSUFFLATION 14GA 120MM (NEEDLE) ×3 IMPLANT
NEEDLE SPNL 22GX7 QUINCKE BK (NEEDLE) ×3 IMPLANT
PACK ROBOT UROLOGY CUSTOM (CUSTOM PROCEDURE TRAY) ×3 IMPLANT
PAD POSITIONING PINK XL (MISCELLANEOUS) ×3 IMPLANT
PORT ACCESS TROCAR AIRSEAL 12 (TROCAR) ×2 IMPLANT
PORT ACCESS TROCAR AIRSEAL 5M (TROCAR) ×1
RELOAD STAPLE 45 4.1 GRN THCK (STAPLE) ×2 IMPLANT
SEAL CANN UNIV 5-8 DVNC XI (MISCELLANEOUS) ×8 IMPLANT
SEAL XI 5MM-8MM UNIVERSAL (MISCELLANEOUS) ×12
SET TRI-LUMEN FLTR TB AIRSEAL (TUBING) ×3 IMPLANT
SOLUTION ELECTROLUBE (MISCELLANEOUS) ×3 IMPLANT
SPONGE GAUZE 2X2 STER 10/PKG (GAUZE/BANDAGES/DRESSINGS) ×1
SPONGE LAP 4X18 RFD (DISPOSABLE) ×3 IMPLANT
STAPLE RELOAD 45 GRN (STAPLE) ×2 IMPLANT
STAPLE RELOAD 45MM GREEN (STAPLE) ×3
SUT ETHILON 3 0 PS 1 (SUTURE) ×3 IMPLANT
SUT MNCRL AB 4-0 PS2 18 (SUTURE) ×6 IMPLANT
SUT PDS AB 1 CT1 27 (SUTURE) ×6 IMPLANT
SUT VIC AB 2-0 SH 27 (SUTURE) ×3
SUT VIC AB 2-0 SH 27X BRD (SUTURE) ×2 IMPLANT
SUT VICRYL 0 UR6 27IN ABS (SUTURE) ×3 IMPLANT
SUT VLOC BARB 180 ABS3/0GR12 (SUTURE) ×9
SUTURE VLOC BRB 180 ABS3/0GR12 (SUTURE) ×6 IMPLANT
SYR 27GX1/2 1ML LL SAFETY (SYRINGE) ×3 IMPLANT
TOWEL OR NON WOVEN STRL DISP B (DISPOSABLE) ×3 IMPLANT
TROCAR XCEL NON-BLD 5MMX100MML (ENDOMECHANICALS) IMPLANT
WATER STERILE IRR 1000ML POUR (IV SOLUTION) ×3 IMPLANT

## 2020-10-06 NOTE — H&P (Signed)
Douglas Sims is an 65 y.o. male.    Chief Complaint: Pre-OP Radical Prostatectomy  HPI:  1 - Low Risk Prostate Cancer - 2 core up to 40% Grade 1 cancer by BX 08/2019 on eval PSA 8.4 and worsening LUTS. TRUS 35gm no medain.   2 - Bladder Cancer / Caricnoma in [place] treated with induction BCG at [facility] 2017.  Recent Course:  08/2019 - cysto - no recurrence   3 - Peyronie's + Erectile Dysfunction - s/p penile prosthesis 7544 by [place] at [facility]. IPP resevoir in space of retzius with mass effect on bladder.    PMH sig for HTN, HLD, Diability (for superficial bladder cancer, unusual indication). His PCP is Dr. Collene Mares in [place].   Today " Douglas Sims" is seen to proceed with radical prostatectomy for large volume low risk cancer.C19 screen negative. Has cards clearance for lower risk PAF>     Past Medical History:  Diagnosis Date   Anxiety    Arthritis    Bladder cancer ([facility])    GERD (gastroesophageal reflux disease)    PAF (paroxysmal atrial fibrillation) ([facility])    Prostate cancer ([facility])    Wears glasses     Past Surgical History:  Procedure Laterality Date   BLADDER SURGERY     COLONOSCOPY     COLONOSCOPY N/A 01/16/2016   Procedure: COLONOSCOPY;  Surgeon: Danie Binder, MD;  [facility];  Service: Endoscopy;  Laterality: N/A;  230   FASCIECTOMY Left 06/07/2014   Procedure: LEFT PALM FASCIECTOMY LEFT RING FINGER;  Surgeon: Daryll Brod, MD;  Location: [facility];  Service: Orthopedics;  Laterality: Left;   PLANTAR FASCIA SURGERY  2007   left foot   PROSTATE BIOPSY     TONSILLECTOMY     UPPER GI ENDOSCOPY      Family History  Problem Relation Age of Onset   Lung cancer Father    Breast cancer Neg Hx    Pancreatic cancer Neg Hx    Colon cancer Neg Hx    Prostate cancer Neg Hx    Social History:  reports that he quit smoking about 27 years ago. His smoking use included cigarettes. He has a 2.50 pack-year smoking history. He has never used smokeless  tobacco. He reports that he does not drink alcohol and does not use drugs.  Allergies:  Allergies  Allergen Reactions   Codeine Itching    Medications Prior to Admission  Medication Sig Dispense Refill   ALPRAZolam (XANAX) 1 MG tablet Take 1 mg by mouth 3 (three) times daily.     meloxicam (MOBIC) 7.5 MG tablet Take 7.5 mg by mouth daily.      metoprolol succinate (TOPROL-XL) 25 MG 24 hr tablet Take 1 tablet (25 mg total) by mouth daily. 30 tablet 0   omeprazole (PRILOSEC) 20 MG capsule TAKE 1 CAPSULE BY MOUTH TWICE DAILY 30  MINUTES BEFORE BREAKFAST AND DINNER FOR ACID  REFLUX. (Patient taking differently: TAKE 1 CAPSULE BY MOUTH TWICE DAILY 30  MINUTES BEFORE BREAKFAST AND DINNER FOR ACID  REFLUX.) 60 capsule 11   tamsulosin (FLOMAX) 0.4 MG CAPS capsule Take 0.4 mg by mouth daily.     aspirin 81 MG EC tablet Take by mouth.     nitroGLYCERIN (NITROSTAT) 0.4 MG SL tablet Place 1 tablet (0.4 mg total) under the tongue every 5 (five) minutes as needed for chest pain. 30 tablet 0    Results for orders placed or performed during the  hospital encounter of 10/04/20 (from the past 48 hour(s))  TSH     Status: Abnormal   Collection Time: 10/04/20  2:31 PM  Result Value Ref Range   TSH 5.085 (H) 0.350 - 4.500 uIU/mL    Comment: Performed by a 3rd Generation assay with a functional sensitivity of <=0.01 uIU/mL. Performed at [facility], [address], [place], [place] [address]   T3, free     Status: None   Collection Time: 10/04/20  2:31 PM  Result Value Ref Range   T3, Free 3.3 2.0 - 4.4 pg/mL    Comment: (NOTE) Performed At: [facility] [place], [place] [id number] Rush Farmer MD [id number]   T4, free     Status: None   Collection Time: 10/04/20  2:31 PM  Result Value Ref Range   Free T4 0.91 0.61 - 1.12 ng/dL    Comment: (NOTE) Biotin ingestion may interfere with free T4 tests. If the results are inconsistent with the TSH level, previous test results,  or the clinical presentation, then consider biotin interference. If needed, order repeat testing after stopping biotin. Performed at [facility], [place] [address] [place], [place] [address]    No results found.  Review of Systems  Constitutional:  Negative for chills and fever.  All other systems reviewed and are negative.  Blood pressure (!) 147/80, pulse 65, temperature 97.9 F (36.6 C), temperature source Oral, resp. rate 17, weight 100.7 kg, SpO2 99 %. Physical Exam Vitals reviewed.  HENT:     Head: Normocephalic.     Mouth/Throat:     Mouth: Mucous membranes are moist.  Cardiovascular:     Rate and Rhythm: Normal rate.  Pulmonary:     Effort: Pulmonary effort is normal.  Abdominal:     General: Abdomen is flat.  Genitourinary:    Comments: IPP in place w/o local reaction Musculoskeletal:     Cervical back: Normal range of motion.  Skin:    General: Skin is warm.  Neurological:     General: No focal deficit present.     Mental Status: He is alert.  Psychiatric:        Mood and Affect: Mood normal.     Assessment/Plan  Proceed as planned with robotic prostatecotmy / limited pelvic node dissection for large volume low grade cancer. Risks (uncluding infection and damage to IPP), benefits, alternatives, exepected peri-op course discussed previously and reiterated today.   [name], MD 10/06/2020, 6:35 AM

## 2020-10-06 NOTE — Progress Notes (Signed)
I assumed care for pt around 1515 NSR with HR-80's.  PACU reported  BP-160/97 HR-80's  RR-17 T-99.7

## 2020-10-06 NOTE — Transfer of Care (Signed)
Immediate Anesthesia Transfer of Care Note  Patient: Douglas Sims  Procedure(s) Performed: XI ROBOTIC ASSISTED LAPAROSCOPIC RADICAL PROSTATECTOMY LIMITED LYMPH NODE DISSECTION (Bilateral)  Patient Location: PACU  Anesthesia Type:General  Level of Consciousness: awake, drowsy and patient cooperative  Airway & Oxygen Therapy: Patient Spontanous Breathing and Patient connected to face mask oxygen  Post-op Assessment: Report given to RN and Post -op Vital signs reviewed and stable  Post vital signs: Reviewed and stable  Last Vitals:  Vitals Value Taken Time  BP 155/92 10/06/20 1049  Temp    Pulse 75 10/06/20 1053  Resp    SpO2 100 % 10/06/20 1053  Vitals shown include unvalidated device data.  Last Pain:  Vitals:   10/06/20 0544  TempSrc: Oral  PainSc: 0-No pain         Complications: No notable events documented.

## 2020-10-06 NOTE — Discharge Instructions (Addendum)
1- Drain Sites - You may have some mild persistent drainage from old drain site for several days, this is normal. This can be covered with cotton gauze for convenience.  2 - Stiches - Your stitches are all dissolvable. You may notice a "loose thread" at your incisions, these are normal and require no intervention. You may cut them flush to the skin with fingernail clippers if needed for comfort.  3 - Diet - No restrictions  4 - Activity - No heavy lifting / straining (any activities that require valsalva or "bearing down") x 4 weeks. Otherwise, no restrictions.  5 - Bathing - You may shower immediately. Do not take a bath or get into swimming pool where incision sites are submersed in water x 4 weeks.   6 - Catheter - Will remain in place until removed at your next appointment. It may be cleaned with soap and water in the shower. It may be disconnected from the drain bag while in the shower to avoid tripping over the tube. You may apply Neosporin or Vaseline ointment as needed to the tip of the penis where the catheter inserts to reduce friction and irritation in this spot.   7 - When to Call the Doctor - Call MD for any fever >102, any acute wound problems, or any severe nausea / vomiting. You can call the [facility] [phone]) 24 hours a day 365 days a year. It will roll-over to the answering service and on-call physician after hours.   You may resume mobic, aspirin, advil, aleve, vitamins, and supplements 7 days after surgery.

## 2020-10-06 NOTE — Plan of Care (Signed)
  Problem: Health Behavior/Discharge Planning: Goal: Ability to manage health-related needs will improve Outcome: Progressing   Problem: Activity: Goal: Risk for activity intolerance will decrease Outcome: Progressing   Problem: Nutrition: Goal: Adequate nutrition will be maintained Outcome: Progressing   Problem: Clinical Measurements: Goal: Will remain free from infection Outcome: Progressing

## 2020-10-06 NOTE — Progress Notes (Signed)
Pt ambulated on hall way twice at this time, tolerated well.

## 2020-10-06 NOTE — Anesthesia Postprocedure Evaluation (Signed)
Anesthesia Post Note  Patient: Douglas Sims  Procedure(s) Performed: XI ROBOTIC ASSISTED LAPAROSCOPIC RADICAL PROSTATECTOMY LIMITED LYMPH NODE DISSECTION (Bilateral)     Patient location during evaluation: PACU Anesthesia Type: General Level of consciousness: sedated Pain management: pain level controlled Vital Signs Assessment: post-procedure vital signs reviewed and stable Respiratory status: spontaneous breathing and respiratory function stable Cardiovascular status: stable Postop Assessment: no apparent nausea or vomiting Anesthetic complications: no   No notable events documented.  Last Vitals:  Vitals:   10/06/20 1150 10/06/20 1200  BP: (!) 150/91 (!) 165/97  Pulse: 72 76  Resp: 12 14  Temp: 36.7 C   SpO2: 100% 100%    Last Pain:  Vitals:   10/06/20 1200  TempSrc:   PainSc: 3                  [name] DANIEL

## 2020-10-06 NOTE — Brief Op Note (Signed)
10/06/2020  10:33 AM  PATIENT:  Douglas Sims  65 y.o. male  PRE-OPERATIVE DIAGNOSIS:  PROSTATE CANCER  POST-OPERATIVE DIAGNOSIS:  PROSTATE CANCER  PROCEDURE:  Procedure(s): XI ROBOTIC ASSISTED LAPAROSCOPIC RADICAL PROSTATECTOMY (N/A) LIMITED LYMPH NODE DISSECTION (Bilateral)  SURGEON:  Surgeon(s) and Role:    [name], MD - Primary  PHYSICIAN ASSISTANT:   ASSISTANTS: [name] PA   ANESTHESIA:   local and general  EBL:  200 mL   BLOOD ADMINISTERED:none  DRAINS:  JP to bulb  , Foley to gravity  LOCAL MEDICATIONS USED:  MARCAINE     SPECIMEN:  Source of Specimen:  prostate, Rt pelvic lymph nodes, Rt bladder neck margin, periprostatic fat  DISPOSITION OF SPECIMEN:  PATHOLOGY  COUNTS:  YES  TOURNIQUET:  * No tourniquets in log *  DICTATION: .Other Dictation: Dictation Number [id number]  PLAN OF CARE: Admit for overnight observation  PATIENT DISPOSITION:  PACU - hemodynamically stable.   Delay start of Pharmacological VTE agent (>24hrs) due to surgical blood loss or risk of bleeding: yes

## 2020-10-06 NOTE — Anesthesia Procedure Notes (Signed)
Procedure Name: Intubation Date/Time: 10/06/2020 7:35 AM Performed by: West Pugh, CRNA Pre-anesthesia Checklist: Patient identified, Emergency Drugs available, Suction available, Patient being monitored and Timeout performed Patient Re-evaluated:Patient Re-evaluated prior to induction Oxygen Delivery Method: Circle system utilized Preoxygenation: Pre-oxygenation with 100% oxygen Induction Type: IV induction Ventilation: Mask ventilation without difficulty Laryngoscope Size: Mac and 4 Grade View: Grade II Tube type: Oral Tube size: 7.5 mm Number of attempts: 1 Airway Equipment and Method: Stylet Placement Confirmation: ETT inserted through vocal cords under direct vision, positive ETCO2 and breath sounds checked- equal and bilateral Secured at: 23 cm Tube secured with: Tape Dental Injury: Teeth and Oropharynx as per pre-operative assessment

## 2020-10-07 ENCOUNTER — Encounter (HOSPITAL_COMMUNITY): Payer: Self-pay | Admitting: Urology

## 2020-10-07 DIAGNOSIS — Z87891 Personal history of nicotine dependence: Secondary | ICD-10-CM | POA: Diagnosis not present

## 2020-10-07 DIAGNOSIS — D360 Benign neoplasm of lymph nodes: Secondary | ICD-10-CM | POA: Diagnosis not present

## 2020-10-07 DIAGNOSIS — C61 Malignant neoplasm of prostate: Secondary | ICD-10-CM | POA: Diagnosis not present

## 2020-10-07 DIAGNOSIS — Z8551 Personal history of malignant neoplasm of bladder: Secondary | ICD-10-CM | POA: Diagnosis not present

## 2020-10-07 DIAGNOSIS — Z7982 Long term (current) use of aspirin: Secondary | ICD-10-CM | POA: Diagnosis not present

## 2020-10-07 DIAGNOSIS — Z79899 Other long term (current) drug therapy: Secondary | ICD-10-CM | POA: Diagnosis not present

## 2020-10-07 LAB — BASIC METABOLIC PANEL
Anion gap: 7 (ref 5–15)
BUN: 16 mg/dL (ref 8–23)
CO2: 26 mmol/L (ref 22–32)
Calcium: 8.1 mg/dL — ABNORMAL LOW (ref 8.9–10.3)
Chloride: 104 mmol/L (ref 98–111)
Creatinine, Ser: 0.75 mg/dL (ref 0.61–1.24)
GFR, Estimated: >60 mL/min (ref >60)
Glucose, Bld: 136 mg/dL — ABNORMAL HIGH (ref 70–99)
Potassium: 4.1 mmol/L (ref 3.5–5.1)
Sodium: 137 mmol/L (ref 135–145)

## 2020-10-07 LAB — HEMOGLOBIN AND HEMATOCRIT, BLOOD
HCT: 37.8 % — ABNORMAL LOW (ref 39.0–52.0)
Hemoglobin: 13.2 g/dL (ref 13.0–17.0)

## 2020-10-07 MED ORDER — CHLORHEXIDINE GLUCONATE CLOTH 2 % EX PADS
6.0000 | MEDICATED_PAD | Freq: Every day | CUTANEOUS | Status: DC
  Administered 2020-10-07: 6 via TOPICAL

## 2020-10-07 NOTE — Discharge Summary (Signed)
[facility] Discharge Summary  Admit date: 10/06/2020  Discharge date and time: 10/07/20   Discharge to: Home  Discharge Service: Urology  Discharge Attending Physician:  Marisue Ivan  Discharge  Diagnoses: <principal problem not specified>  Secondary Diagnosis: Active Problems:   Prostate cancer ([place])   OR Procedures: Procedure(s): XI ROBOTIC ASSISTED LAPAROSCOPIC RADICAL PROSTATECTOMY LIMITED LYMPH NODE DISSECTION 10/06/2020   Ancillary Procedures: None   Discharge Day Services: The patient was seen and examined by the Urology team both in the morning and immediately prior to discharge.  Vital signs and laboratory values were stable and within normal limits.  The physical exam was benign and unchanged and all surgical wounds were examined.  Discharge instructions were explained and all questions answered.  Subjective  No acute events overnight. Pain Controlled. No fever or chills.  Objective No data found. Total I/O In: -  Out: 750 [Urine:750]  General Appearance:        No acute distress Lungs:                       Normal work of breathing on room air Heart:                                Regular rate and rhythm Abdomen:                         Soft, non-tender, non-distended, incisions clean dry and intact. JP drain removed and site dressed Extremities:                      Warm and well perfused   Hospital Course:  The patient underwent RALP on 10/06/2020.  The patient tolerated the procedure well, was extubated in the OR, and afterwards was taken to the PACU for routine post-surgical care. When stable the patient was transferred to the floor.   The patient did well postoperatively.  The patient's diet was slowly advanced and at the time of discharge was tolerating a regular diet.  The patient was discharged home 1 Day Post-Op, at which point was tolerating a regular solid diet,  have adequate pain control with P.O. pain medication, and could ambulate without  difficulty. The patient will follow up with [place] for post op check and catheter removal and will take antibiotics until that time given presence of IPP reservoir in abdomen.  Condition at Discharge: Improved  Discharge Medications:  Allergies as of 10/07/2020       Reactions   Codeine Itching        Medication List     STOP taking these medications    aspirin 81 MG EC tablet   meloxicam 7.5 MG tablet Commonly known as: MOBIC   tamsulosin 0.4 MG Caps capsule Commonly known as: FLOMAX       TAKE these medications    ALPRAZolam 1 MG tablet Commonly known as: XANAX Take 1 mg by mouth 3 (three) times daily.   docusate sodium 100 MG capsule Commonly known as: COLACE Take 1 capsule (100 mg total) by mouth 2 (two) times daily.   HYDROcodone-acetaminophen 5-325 MG tablet Commonly known as: Norco Take 1-2 tablets by mouth every 6 (six) hours as needed for moderate pain.   metoprolol succinate 25 MG 24 hr tablet Commonly known as: TOPROL-XL Take 1 tablet (25 mg total) by mouth daily.   nitroGLYCERIN 0.4 MG  SL tablet Commonly known as: NITROSTAT Place 1 tablet (0.4 mg total) under the tongue every 5 (five) minutes as needed for chest pain.   omeprazole 20 MG capsule Commonly known as: PRILOSEC TAKE 1 CAPSULE BY MOUTH TWICE DAILY 30  MINUTES BEFORE BREAKFAST AND DINNER FOR ACID  REFLUX.   sulfamethoxazole-trimethoprim 800-160 MG tablet Commonly known as: BACTRIM DS Take 1 tablet by mouth daily.

## 2020-10-07 NOTE — Progress Notes (Signed)
Provided and discussed discharge information. IV removed intact. Addressed all questions and concerns. Jerene Pitch

## 2020-10-09 NOTE — Op Note (Signed)
NAME: Douglas Sims, GOVER MEDICAL RECORD NO: [id number] ACCOUNT NO: [id number] DATE OF BIRTH: February 03, 1956 FACILITY: [facility] LOCATION: [id number] PHYSICIAN: Douglas Frock, MD  Operative Report   DATE OF PROCEDURE: 10/06/2020  PREOPERATIVE DIAGNOSIS:  Large volume, low-risk Prostate cancer; history of penile prosthesis.  PROCEDURES:  1.  Robotic-assisted laparoscopic radical prostatectomy. 2.  Pelvic lymphadenectomy.  ESTIMATED BLOOD LOSS:  200 mL.  COMPLICATIONS:  None.  ASSISTANT: [name] PA  SPECIMENS:    1.  Radical prostatectomy. 2.  Periprostatic fat. 3.  Right bladder neck margin. 4.  Pelvic lymph nodes.  INDICATIONS:  The patient is a 65 year old gentleman with markedly elevated and rising PSA to have multifocal relatively large volume, but low-grade adenocarcinoma of the prostate.  His volume of disease put him outside of surveillance protocols.   Options were discussed including surveillance protocols versus surgical extirpation versus ablative therapies, and he adamantly wished to proceed with surgical extirpation.  He does have a history of penile prosthesis placed years ago that is working  well with a reservoir in the very lateral space of Retzius on the left.  Preoperative imaging was carefully reviewed and was felt to be a safe plane to perform safely robotically. Informed consent was obtained and placed in medical record.  DESCRIPTION OF PROCEDURE:  The patient being Douglas Sims verified.  Procedure being radical prostatectomy was confirmed.  Procedure timeout was performed.  Intravenous antibiotics were administered.  General endotracheal anesthesia were induced.  The  patient was placed into a low lithotomy position.  Sterile field was created, prepped and draped the patient's penis, perineum, and proximal thigh using iodine and his infraxiphoid abdomen using chlorhexidine gluconate.  He was further fastened to the  operating table using 3-inch tape with foam padding  across the subxiphoid chest.  Test of steep Trendelenburg positioning was performed and found to be suitably positioned.  A Foley catheter was placed to straight drain.  Next, a high-flow, low pressure  pneumoperitoneum was obtained using Veress technique in the supraumbilical midline having passed the aspiration drop test.  An 8 mm robotic camera port was placed in same location.  Laparoscopic examination of the peritoneal cavity revealed no  significant adhesions, no visceral injury.  Additional ports were placed as follows.  Right paramedian 8 mm robotic port, right far lateral 12 mm AirSeal assistant port, right paramedian 5 mm suction port, left paramedian 8 mm robotic port, left far  lateral 8 mm robotic port.  The robot was docked and passed the electronic checks.  There were some loose sigmoid adhesions mostly interloop.  These were carefully taken down to create more room in the true pelvis.  There was mass effect immediately  noted on the left side on the lateral bladder area from the known penile prosthesis reservoir was identified this.  The penile prosthesis was purposely inflated to decrease amount of fluid within the reservoir and this decreased the volume  by approximately 60%.  Incision was made lateral to the right median umbilical ligament from the midline towards the area of internal ring coursing along the area of vessels towards the area of the right ureter, which was positively identified.  Right  vas deferens was encountered and purposely ligated to use as the medial bucket handle.  The right bladder wall was carefully took away from the lateral aspect of the abdominal wall towards the area of the endopelvic fascia.  This exposed the lymph node  field on the right side and limited lymphadenectomy was performed of  the right external and right obturator groups, the boundaries being right external iliac artery, vein, pelvic sidewall and obturator nerve.  Hemostasis was achieved with cold  clips.   This was set aside and labeled right pelvic lymph nodes.  The right obturator nerve was inspected following maneuvers and was found to be uninjured.  The space of Retzius was very carefully developed further just to the left side of midline allowing  visualization of the prostate base and left endopelvic fascia.  Dissection was performed in a fashion as not to interrupt the perivesical place where the reservoir was, and the reservoir and tubing were purposely not interrupted or visualized.  The  endopelvic fascia was swept away from the lateral aspect of the prostate in a base to apex orientation.  This exposed the dorsal venous complex, which was controlled using green load stapler with exquisite care to avoid membranous sheath injury, which  did not occur.  The area overlying the prostate base and bladder neck was defatted to better denote this junction, and a lateral release was performed on each side to better denote the caliber of the bladder neck and prostatic urethra and bladder neck  was separated away from the base of the prostate in anterior and posterior direction keeping what appeared to be remnant of circular muscle fibers with each plane of dissection.  At the right aspect of the bladder neck margin well within the muscular  fibers of the bladder neck, there was a pocket of somewhat necrotic-appearing material.  This was very small, approximately 5 mm.  This appeared to be extension from the prostate.  This was not immediately consistent with any sort of carcinoma in this  location given his low-grade disease.  There was no active purulence.  It was carefully suctioned, and a plane was chosen purposely wide to this and a final right bladder neck margin set aside for permanent pathology.  On the off chance, this area was in  fact neoplastic.  Posterior dissection was performed by incising approximately 7 mm inferior posterior to the posterior lip of the prostate entering the plane of  Denonvilliers.  Bilateral vas deferens were encountered, dissected for a distance of  approximately 4 cm, ligated and placed on gentle superior traction.  Bilateral seminal vesicles were dissected to their tips and placed on gentle superior traction.  There was significant desmoplasia around the spleen without any obvious signs of  infection.  This plane was further developed posterior to the apex of the prostate.  This exposed the vascular pedicles on each side, which were controlled using sequential clipping technique in a base to apex orientation.  Final apical dissection was  performed.  The anterior plane was placed in the prostate on superior traction transecting the membranous urethra coldly.  This completely freed up the prostate specimen, which was placed into an EndoCatch bag for later retrieval.  Digital rectal exam  was then performed using an indictor glove and no evidence of rectal violation was noted.  Posterior reconstruction was performed using 3-0 V-Loc suture reapproximating the posterior urethral plate to the posterior bladder neck, bringing these structures  into tension-free apposition.  A mucosa-to-mucosa anastomosis was performed using double-armed 3-0 V-Loc suture from the 6 o'clock to 12 o'clock position, which showed an excellent tension-free apposition.  A Foley catheter was then placed, which  irrigated quantitatively.  Sponge and needle counts were correct.  Hemostasis was excellent.  We achieved the goal of the extirpative portion of the procedure today.  The area of the prosthesis reservoir was once again inspected, and again this still  remained within the space of Retzius, which had purposely only been partially developed.  I was quite happy with this in order to protect the reservoir from any damage or infection.  The robot was then undocked.  Right lateral most assistant port site  was closed at level of fascia using Carter-Thomason suture passer with 0 Vicryl.  A closed  suction drain was brought through the previous left lateral most lateral site into the peritoneal cavity.  The specimen was retrieved by extending the previous  camera port site inferiorly for a total distance of approximately 3 cm removing the prostatectomy specimen, setting aside for permanent pathology.  The extraction site was closed at the level of the fascia using figure-of-eight PDS x3 followed by  reapproximation of Scarpa's with running Vicryl.  All incision sites were infiltrated with dilute lipolyzed Marcaine and closed at the level of skin using subcuticular Monocryl with Dermabond.  The procedure was terminated.  The patient tolerated  procedure well, no immediate complications.  The patient was taken to postanesthesia care in stable condition.  Please note, first assistant, Bari Mantis, was crucial for all portions of the surgery today.  She provided valuable retraction, suctioning, vascular stapling, lymphatic clipping, and general first assistance.   [facility] D: 10/06/2020 10:44:48 am T: 10/06/2020 2:27:00 pm  JOB: [id number]/ [id number]

## 2020-10-10 ENCOUNTER — Telehealth: Payer: Self-pay

## 2020-10-10 NOTE — Telephone Encounter (Signed)
Spoke with pt regarding sleep study. Pt was informed his appt is 10/23/20 at 7:30pm located at [facility]. Pt agreed and confirmed appt.

## 2020-10-11 LAB — SURGICAL PATHOLOGY

## 2020-10-12 ENCOUNTER — Ambulatory Visit: Payer: Medicare HMO | Admitting: Cardiology

## 2020-10-13 NOTE — Telephone Encounter (Signed)
Scheduled   Lauralee Evener, CMA  Freada Bergeron, CMA Ok to schedule HST. No PA is required. Patient has [place].     From: Nelda Marseille  Sent: 10/04/2020   2:29 PM EDT  To: Lauralee Evener, CMA  Subject: Sleep Study Needed                             Sleep Study is needed

## 2020-10-20 ENCOUNTER — Other Ambulatory Visit: Payer: Self-pay

## 2020-10-20 ENCOUNTER — Ambulatory Visit (HOSPITAL_COMMUNITY)
Admission: RE | Admit: 2020-10-20 | Discharge: 2020-10-20 | Disposition: A | Discharge status: Home / Self Care | Payer: Medicare HMO | Source: Ambulatory Visit | Attending: Internal Medicine | Admitting: Internal Medicine

## 2020-10-20 DIAGNOSIS — I4891 Unspecified atrial fibrillation: Secondary | ICD-10-CM | POA: Diagnosis not present

## 2020-10-20 LAB — ECHOCARDIOGRAM COMPLETE
Area-P 1/2: 2.68 cm2
S' Lateral: 3.35 cm

## 2020-10-20 NOTE — Progress Notes (Signed)
*  PRELIMINARY RESULTS* Echocardiogram 2D Echocardiogram has been performed.  Douglas Sims 10/20/2020, 3:20 PM

## 2020-10-23 ENCOUNTER — Ambulatory Visit: Payer: Medicare HMO | Attending: Internal Medicine | Admitting: Cardiology

## 2020-10-23 ENCOUNTER — Other Ambulatory Visit: Payer: Self-pay

## 2020-10-23 DIAGNOSIS — R0683 Snoring: Secondary | ICD-10-CM | POA: Diagnosis not present

## 2020-10-23 DIAGNOSIS — G4733 Obstructive sleep apnea (adult) (pediatric): Secondary | ICD-10-CM | POA: Diagnosis not present

## 2020-10-26 NOTE — Procedures (Signed)
   Patient Name: Douglas Sims, Douglas Sims Date: 10/23/2020 Gender: Male D.O.B: 11-Dec-1955 Age (years): 64 Referring Provider: Dorris Carnes Height (inches): 50 Interpreting Physician: Fransico Him MD, ABSM Weight (lbs): 222 RPSGT: Rosebud Poles BMI: 29 MRN: [id number]   CLINICAL INFORMATION Sleep Study Type: HST  Indication for sleep study: N/A  Epworth Sleepiness Score: N/A  SLEEP STUDY TECHNIQUE A multi-channel overnight portable sleep study was performed. The channels recorded were: nasal airflow, thoracic respiratory movement, and oxygen saturation with a pulse oximetry. Snoring was also monitored.  MEDICATIONS Patient self administered medications include: N/A.  SLEEP ARCHITECTURE Patient was studied for 509.2 minutes. The sleep efficiency was 87.5 % and the patient was supine for 44.1%. The arousal index was 0.2 per hour.  RESPIRATORY PARAMETERS The overall AHI was 5.2 per hour, with a central apnea index of 0 per hour.  The oxygen nadir was 86% during sleep.  CARDIAC DATA Mean heart rate during sleep was 60.9 bpm.  IMPRESSIONS - Mild obstructive sleep apnea occurred during this study (AHI = 5.2/h). - Moderate oxygen desaturation was noted during this study (Min O2 = 86%). - Patient snored 25.0% during the sleep.  DIAGNOSIS - Obstructive Sleep Apnea (G47.33)  RECOMMENDATIONS - Positional therapy avoiding supine position during sleep. - Oral appliance may be considered. - Avoid alcohol, sedatives and other CNS depressants that may worsen sleep apnea and disrupt normal sleep architecture. - Sleep hygiene should be reviewed to assess factors that may improve sleep quality. - Weight management and regular exercise should be initiated or continued. - Recommend in lab PSG given borderline study.  [Electronically signed] 10/26/2020 02:46 PM  Fransico Him MD, ABSM Diplomate, American Board of Sleep Medicine

## 2020-10-27 ENCOUNTER — Telehealth: Payer: Self-pay | Admitting: Cardiology

## 2020-10-27 ENCOUNTER — Other Ambulatory Visit: Payer: Self-pay

## 2020-10-27 DIAGNOSIS — Z13220 Encounter for screening for lipoid disorders: Secondary | ICD-10-CM

## 2020-10-27 DIAGNOSIS — Z131 Encounter for screening for diabetes mellitus: Secondary | ICD-10-CM

## 2020-10-27 NOTE — Telephone Encounter (Signed)
Pt is returning call from earlier today, he thinks it's for his recent sleep study results. Please advise pt further

## 2020-10-27 NOTE — Telephone Encounter (Signed)
-----   Message from Fay Records, MD sent at 10/27/2020  9:48 AM EDT ----- Echo looks good   Pumping function is normal    Left a VM that someone would call Pumping function is good  Valves working well    Follow clinically   Keep on current meds  Cut out sugars as discussed in clinic    CT scan showed fatty changes in liver that are due to sweets (sweet tea)That increases risk for diabetes and heart disease

## 2020-10-27 NOTE — Telephone Encounter (Signed)
Pt notified and voiced understanding. Lab work orders placed per PR.

## 2020-10-31 ENCOUNTER — Telehealth: Payer: Self-pay | Admitting: *Deleted

## 2020-10-31 DIAGNOSIS — Z131 Encounter for screening for diabetes mellitus: Secondary | ICD-10-CM | POA: Diagnosis not present

## 2020-10-31 DIAGNOSIS — Z13220 Encounter for screening for lipoid disorders: Secondary | ICD-10-CM | POA: Diagnosis not present

## 2020-10-31 NOTE — Telephone Encounter (Signed)
-----   Message from Sueanne Margarita, MD sent at 10/26/2020  2:49 PM EDT ----- Borderline minimal OSA - please set patient up for in lab PSG

## 2020-10-31 NOTE — Telephone Encounter (Signed)
The patient has been notified of the result and verbalized understanding.  All questions (if any) were answered. Marolyn Hammock, [place] 10/31/2020 11:05 AM   Patient has declined the in lab test stating he thinks he sleeps fine at night. He sleeps all night long. He is energized and not tired in the mornings. Patient says thank you for everything.

## 2020-11-01 LAB — APOLIPOPROTEIN B: Apolipoprotein B: 73 mg/dL (ref <90)

## 2020-11-01 LAB — NMR, LIPOPROFILE
Cholesterol, Total: 150 mg/dL (ref 100–199)
HDL Particle Number: 24.4 umol/L — ABNORMAL LOW (ref >=30.5)
HDL-C: 40 mg/dL (ref >39)
LDL Particle Number: 1041 nmol/L — ABNORMAL HIGH (ref <1000)
LDL Size: 20.8 nm (ref >20.5)
LDL-C (NIH Calc): 92 mg/dL (ref 0–99)
LP-IR Score: 67 — ABNORMAL HIGH (ref <=45)
Small LDL Particle Number: 394 nmol/L (ref <=527)
Triglycerides: 97 mg/dL (ref 0–149)

## 2020-11-01 LAB — LIPOPROTEIN A (LPA): Lipoprotein (a): <8.4 nmol/L (ref <75.0)

## 2020-11-01 LAB — HEMOGLOBIN A1C
Est. average glucose Bld gHb Est-mCnc: 123 mg/dL
Hgb A1c MFr Bld: 5.9 % — ABNORMAL HIGH (ref 4.8–5.6)

## 2020-11-06 ENCOUNTER — Telehealth: Payer: Self-pay

## 2020-11-06 NOTE — Telephone Encounter (Signed)
Pt notified and verbalized understanding. Pt had no questions or concerns at this time.  

## 2020-11-06 NOTE — Telephone Encounter (Signed)
-----   Message from Dorris Carnes V, MD sent at 11/05/2020 10:10 PM EDT ----- Hgb A1C is  minimally elevated at 5.9   Watch carbohydrates/sweets Lpids are pretty good   LDL 92  HDL 40   Triglyceride 97

## 2021-01-15 DIAGNOSIS — C678 Malignant neoplasm of overlapping sites of bladder: Secondary | ICD-10-CM | POA: Diagnosis not present

## 2021-01-20 IMAGING — CT CT ABD-PEL WO/W CM
3 of 12 series · 10 of 46 positions shown, 16 images · IV contrast (Omnipaque or Isovue)
Comparison: MRI of the abdomen from 5821

CLINICAL DATA: Ureterocele, history of bladder cancer post prostate
biopsy.

EXAM:
CT ABDOMEN AND PELVIS WITHOUT AND WITH CONTRAST
TECHNIQUE: Multidetector CT imaging of the abdomen and pelvis was performed
following the standard protocol before and following the bolus
administration of intravenous contrast.
CONTRAST:  125mL OMNIPAQUE IOHEXOL 300 MG/ML  SOLN

[Series 2: axial pre · axial · non-contrast · 0.76mm/px · z∈[+966,+1291]mm · 4 of 109 slices shown]
[im 22/109  soft-tissue]
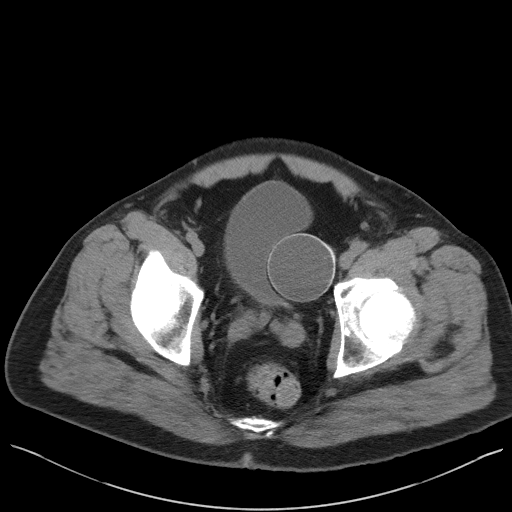
[im 44/109  soft-tissue]
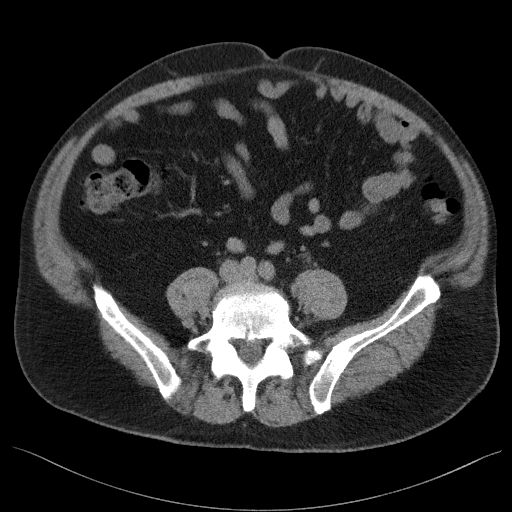
[im 65/109  soft-tissue]
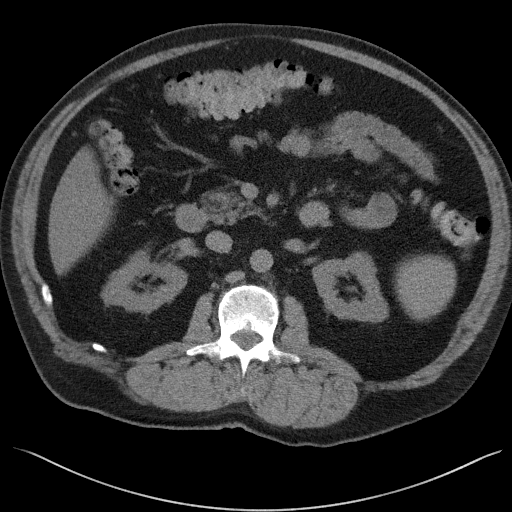
[im 87/109  soft-tissue]
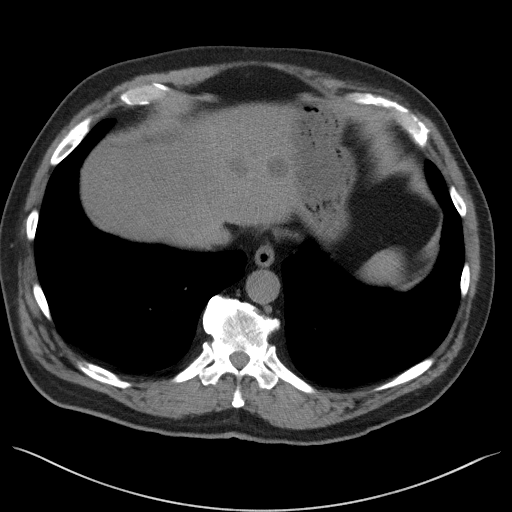

[Series 5: coronal pre · coronal · non-contrast · 0.84mm/px · 2 of 108 slices shown, 3 images]
[im 36/108  soft-tissue]
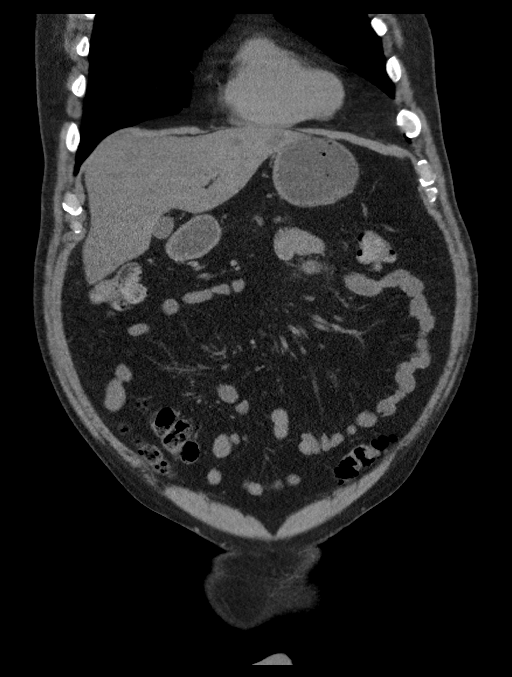
[im 36/108  bone]
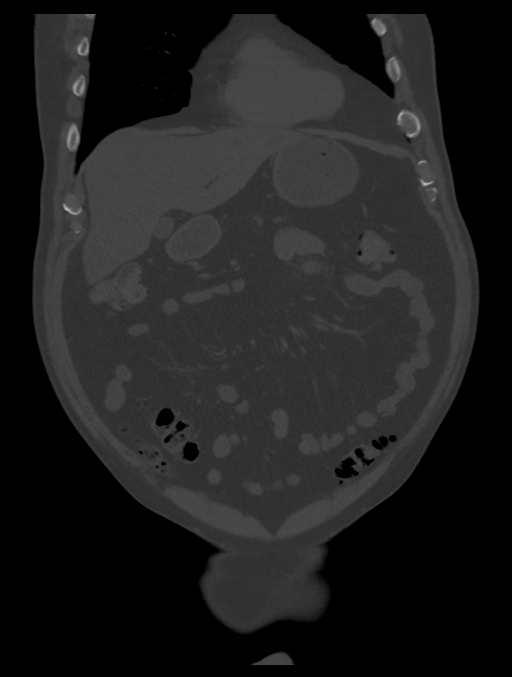
[im 72/108  soft-tissue]
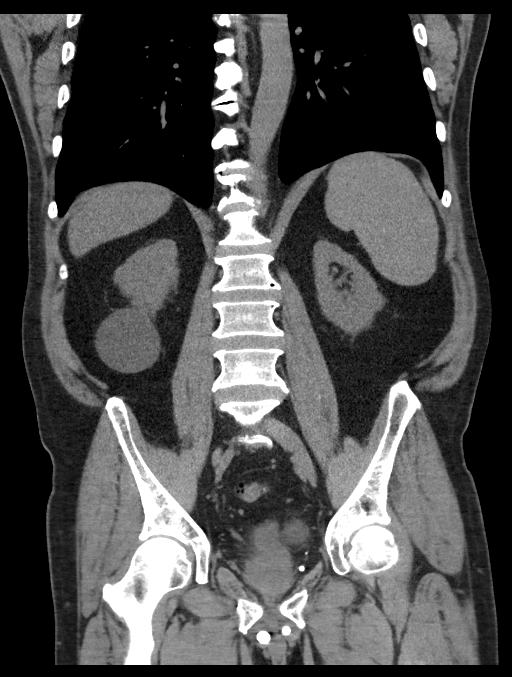

[Series 7: axial post · axial · 0.78mm/px · z∈[+941,+1286]mm · 4 of 115 slices shown, 9 images]
[im 23/115  soft-tissue]
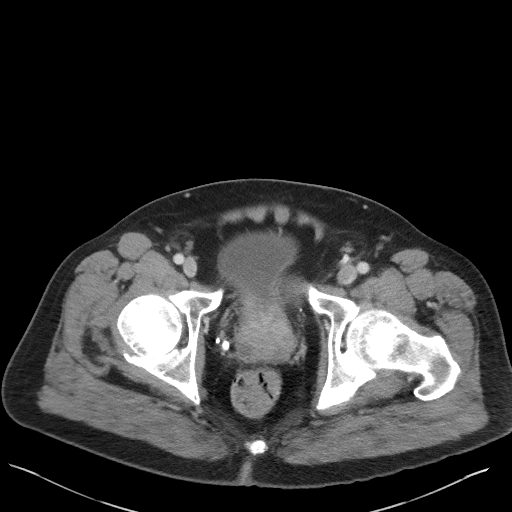
[im 23/115  lung]
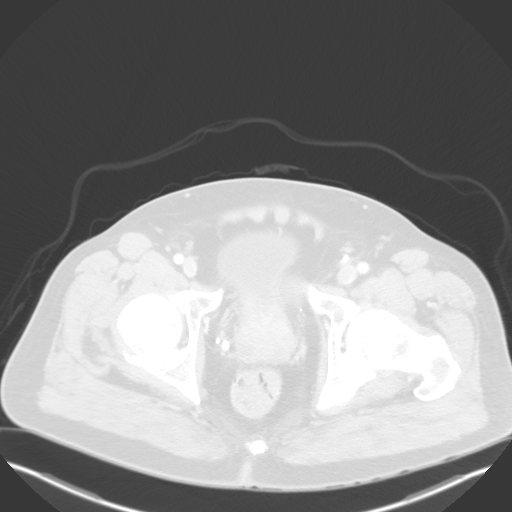
[im 23/115  bone]
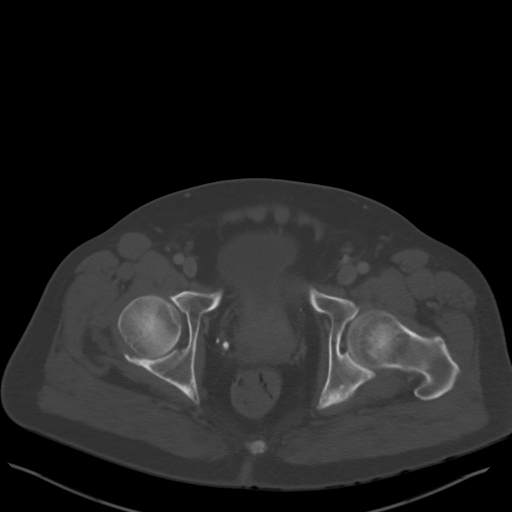
[im 46/115  soft-tissue]
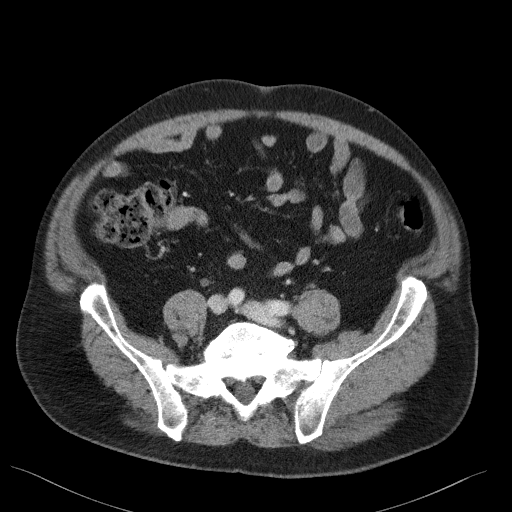
[im 46/115  lung]
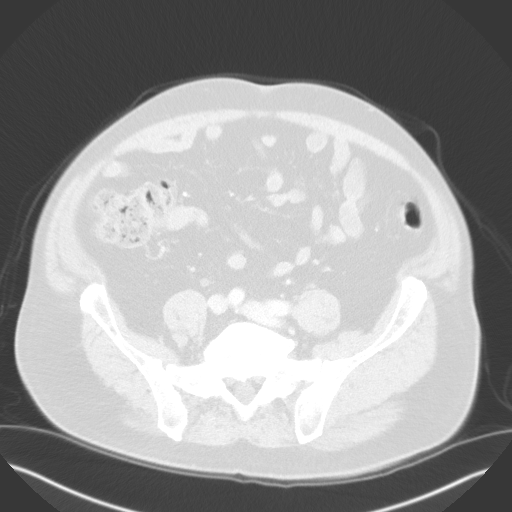
[im 69/115  soft-tissue]
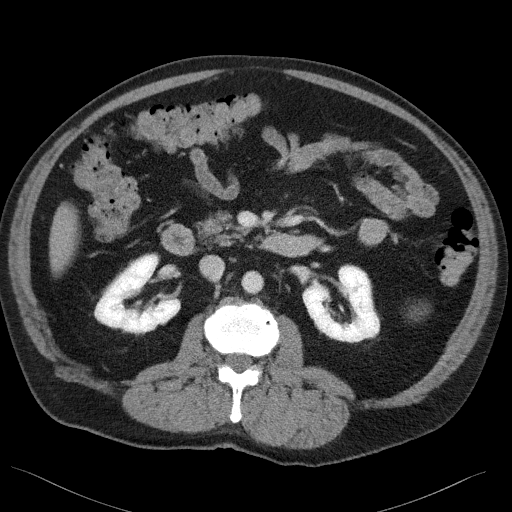
[im 69/115  lung]
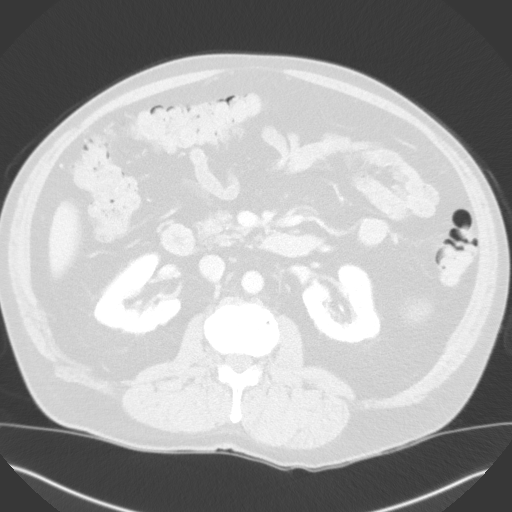
[im 92/115  soft-tissue]
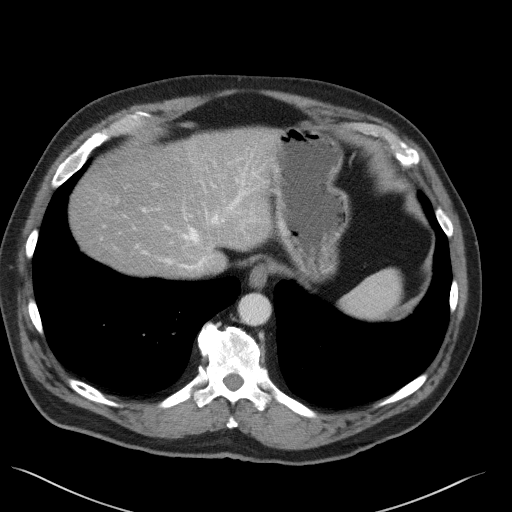
[im 92/115  lung]
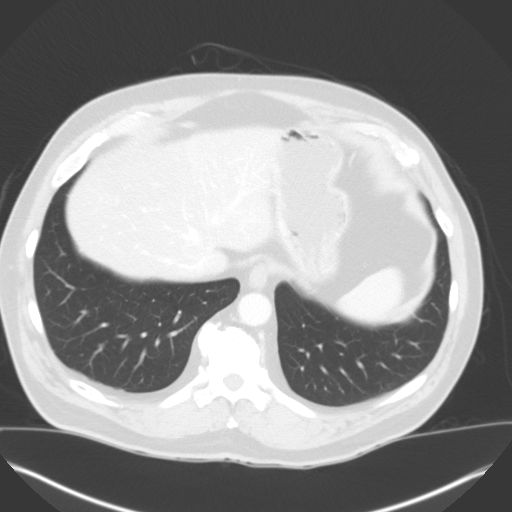

[10 of 46 positions shown; findings below may reference images not displayed]

FINDINGS: Lower chest: Small pulmonary nodule in the LEFT lung base. 5 mm. No
consolidation. No pleural effusion. Small pulmonary nodules at the
RIGHT lung base (image 68, series 4) largest 3 mm. Also with a small
juxta diaphragmatic nodule in the RIGHT chest approximately 4 mm.

Small nodule along the minor fissure in the RIGHT chest (image 14,
series 4) 4 mm. Other smaller nodules seen at the lung bases
including calcified granulomas.

Hepatobiliary: Signs of hepatic steatosis with similar appearance to
the prior imaging study. Is signs of hepatic cysts and hemangiomata
without change. Portal vein is patent.

Hepatic veins are patent. No biliary duct dilation. Lesion of the
gallbladder fundus showing increased density and more solid
appearance since previous imaging measuring 1 by 1.1 cm. No
pericholecystic stranding. There is some calcification. Size is
stable when compared to the prior study and there was a suggestion
of a biliary calculus within this location.

Pancreas: Pancreas without focal lesion or ductal dilation.

Spleen: Spleen normal size and contour.

Adrenals/Urinary Tract: Adrenal glands are normal.

Kidneys with mild cortical scarring. RIGHT renal cyst unchanged from
previous imaging. No hydronephrosis. No nephrolithiasis.

Symmetric renal enhancement. No signs of abnormal enhancement along
the is visualized urothelium.

Urinary bladder with mass effect from a reservoir or for a penile
prosthesis. Deviation from LEFT to RIGHT within the pelvis. Limited
distension without visible focal

Stomach/Bowel: Stomach grossly normal. Small bowel without acute
process. Appendix is normal. Diffuse colonic diverticulosis.

Vascular/Lymphatic: No aneurysmal dilation of the abdominal aorta.
No adenopathy in the retroperitoneum or upper abdomen.

No pelvic lymphadenopathy.

Thickening.

Reproductive: Prostate with heterogeneity, nonspecific finding on
CT. Penile prosthesis in place.

Other: No ascites.

Musculoskeletal: Spinal degenerative changes. No acute or
destructive bone process.

Excretory imaging: No visible filling defect or sign of stricture on
excretory phase evaluation.
IMPRESSION: 1. Lesion of the gallbladder fundus showing increased density and
more solid appearance since previous imaging measuring 1 by 1.1 cm.
Size is stable when compared to the prior study and there was a
suggestion of a biliary calculus within this location. Suggest
follow-up ultrasound for further assessment. This could represent
adenomyomatosis of the gallbladder fundus, associated with biliary
calculus in the internal portion. Ultrasound is a good first step in
the further evaluation of this finding. Gallbladder neoplasm is felt
unlikely but is not excluded. If ultrasound is unrevealing is or
equivocal MRI could be performed.
2. Signs of hepatic steatosis with similar appearance to the prior
imaging.
3. Small pulmonary nodules at the lung bases, largest 5 mm. No
follow-up needed if patient is low-risk (and has no known or
suspected primary neoplasm). Non-contrast chest CT can be considered
in 12 months if patient is high-risk. This recommendation follows
the consensus statement: Guidelines for Management of Incidental
Pulmonary Nodules Detected on CT Images: From the [HOSPITAL]
4. Diffuse colonic diverticulosis without evidence of acute
diverticulitis.
5. Prostate with heterogeneity, nonspecific finding on CT.
6. Penile prosthesis in place.
7. Aortic atherosclerosis.

Aortic Atherosclerosis ([id number]-FQ0.0).

## 2021-01-24 ENCOUNTER — Telehealth: Payer: Self-pay

## 2021-01-24 NOTE — Telephone Encounter (Signed)
NOTES SCANNED TO REFERRAL RJ 

## 2021-05-30 DIAGNOSIS — Z0000 Encounter for general adult medical examination without abnormal findings: Secondary | ICD-10-CM | POA: Diagnosis not present

## 2021-05-30 DIAGNOSIS — K219 Gastro-esophageal reflux disease without esophagitis: Secondary | ICD-10-CM | POA: Diagnosis not present

## 2021-05-30 DIAGNOSIS — M1990 Unspecified osteoarthritis, unspecified site: Secondary | ICD-10-CM | POA: Diagnosis not present

## 2021-05-30 DIAGNOSIS — Z23 Encounter for immunization: Secondary | ICD-10-CM | POA: Diagnosis not present

## 2021-05-30 DIAGNOSIS — Z9229 Personal history of other drug therapy: Secondary | ICD-10-CM | POA: Diagnosis not present

## 2021-05-30 DIAGNOSIS — Z0001 Encounter for general adult medical examination with abnormal findings: Secondary | ICD-10-CM | POA: Diagnosis not present

## 2021-07-09 DIAGNOSIS — C678 Malignant neoplasm of overlapping sites of bladder: Secondary | ICD-10-CM | POA: Diagnosis not present

## 2021-11-23 DIAGNOSIS — R42 Dizziness and giddiness: Secondary | ICD-10-CM | POA: Diagnosis not present

## 2021-11-23 DIAGNOSIS — I4811 Longstanding persistent atrial fibrillation: Secondary | ICD-10-CM | POA: Diagnosis not present

## 2021-11-23 DIAGNOSIS — I700 Atherosclerosis of aorta: Secondary | ICD-10-CM | POA: Diagnosis not present

## 2021-11-23 DIAGNOSIS — E039 Hypothyroidism, unspecified: Secondary | ICD-10-CM | POA: Diagnosis not present

## 2021-11-23 DIAGNOSIS — C678 Malignant neoplasm of overlapping sites of bladder: Secondary | ICD-10-CM | POA: Diagnosis not present

## 2021-11-23 DIAGNOSIS — Z0001 Encounter for general adult medical examination with abnormal findings: Secondary | ICD-10-CM | POA: Diagnosis not present

## 2021-11-23 DIAGNOSIS — M1991 Primary osteoarthritis, unspecified site: Secondary | ICD-10-CM | POA: Diagnosis not present

## 2021-11-23 DIAGNOSIS — E559 Vitamin D deficiency, unspecified: Secondary | ICD-10-CM | POA: Diagnosis not present

## 2022-01-15 DIAGNOSIS — C678 Malignant neoplasm of overlapping sites of bladder: Secondary | ICD-10-CM | POA: Diagnosis not present

## 2022-03-07 ENCOUNTER — Encounter: Payer: Self-pay | Admitting: Student

## 2022-03-07 ENCOUNTER — Ambulatory Visit: Payer: Medicare Other | Attending: Student | Admitting: Student

## 2022-03-07 VITALS — BP 124/68 | HR 136 | Ht 73.0 in | Wt 210.0 lb

## 2022-03-07 DIAGNOSIS — I10 Essential (primary) hypertension: Secondary | ICD-10-CM | POA: Diagnosis not present

## 2022-03-07 DIAGNOSIS — I4819 Other persistent atrial fibrillation: Secondary | ICD-10-CM

## 2022-03-07 DIAGNOSIS — K219 Gastro-esophageal reflux disease without esophagitis: Secondary | ICD-10-CM

## 2022-03-07 MED ORDER — METOPROLOL SUCCINATE ER 50 MG PO TB24: 50.0000 mg | ORAL_TABLET | Freq: Every day | ORAL | 3 refills | Status: DC

## 2022-03-07 MED ORDER — RIVAROXABAN 20 MG PO TABS: 20.0000 mg | ORAL_TABLET | Freq: Every day | ORAL | 6 refills | Status: AC

## 2022-03-07 NOTE — Progress Notes (Signed)
Cardiology Office Note    Date:  03/07/2022   ID:  Douglas REIFSTECK, DOB June 13, 1955, MRN [id number]  PCP:  Redmond School, MD  Cardiologist: Dorris Carnes, MD    Chief Complaint  Patient presents with   Follow-up    Atrial Fibrillation    History of Present Illness:    Douglas Sims is a 66 y.o. male with past medical history of paroxysmal atrial fibrillation, GERD and history of prostate cancer who presents to the office today for follow-up of atrial fibrillation.  He was examined by Dr. Harrington Challenger in 09/2020 as a new patient referral after recent EKG had shown atrial fibrillation. He denied any associated symptoms and reported he typically would walk for 5 miles per day without angina or palpitations. He had converted back to normal sinus rhythm and was continued on Toprol-XL 25 mg daily. It does not appear he was on anticoagulation given his CHA2DS2-VASc score of 1. Echocardiogram showed a preserved EF of 60 to 65% with no regional wall motion abnormalities and only trivial MR. He did have a sleep study which showed borderline minimal OSA and it was recommended to consider an in lab study but he declined.  In talking with the patient today, he reports intermittent palpitations for the past 6+ months.  Says this can occur at rest or with activity and he experiences intermittent lightheadedness as well. Denies any associated chest pain. Does report intermittent dyspnea on exertion. He does drag race vehicles. No recent orthopnea, PND or pitting edema. Says he was evaluated by his PCP over a month ago and was found to be in recurrent atrial fibrillation which prompted him to schedule a follow-up visit.   Past Medical History:  Diagnosis Date   Anxiety    Arthritis    Bladder cancer (HCC)    GERD (gastroesophageal reflux disease)    PAF (paroxysmal atrial fibrillation) (HCC)    Prostate cancer (HCC)    Wears glasses     Past Surgical History:  Procedure Laterality Date   BLADDER SURGERY      COLONOSCOPY     COLONOSCOPY N/A 01/16/2016   Procedure: COLONOSCOPY;  Surgeon: Danie Binder, MD;  Location: [facility];  Service: Endoscopy;  Laterality: N/A;  230   FASCIECTOMY Left 06/07/2014   Procedure: LEFT PALM FASCIECTOMY LEFT RING FINGER;  Surgeon: Daryll Brod, MD;  Location: [place];  Service: Orthopedics;  Laterality: Left;   LYMPH NODE DISSECTION Bilateral 10/06/2020   Procedure: LIMITED LYMPH NODE DISSECTION;  Surgeon: [name], MD;  Location: [facility];  Service: Urology;  Laterality: Bilateral;   PLANTAR FASCIA SURGERY  2007   left foot   PROSTATE BIOPSY     ROBOT ASSISTED LAPAROSCOPIC RADICAL PROSTATECTOMY N/A 10/06/2020   Procedure: XI ROBOTIC ASSISTED LAPAROSCOPIC RADICAL PROSTATECTOMY;  Surgeon: [name], MD;  Location: [facility];  Service: Urology;  Laterality: N/A;   TONSILLECTOMY     UPPER GI ENDOSCOPY      Current Medications: Outpatient Medications Prior to Visit  Medication Sig Dispense Refill   ALPRAZolam (XANAX) 1 MG tablet Take 1 mg by mouth 3 (three) times daily.     docusate sodium (COLACE) 100 MG capsule Take 1 capsule (100 mg total) by mouth 2 (two) times daily.     meloxicam (MOBIC) 7.5 MG tablet Take 7.5 mg by mouth in the morning and at bedtime.     nitroGLYCERIN (NITROSTAT) 0.4 MG SL tablet Place 1 tablet (0.4 mg total) under  the tongue every 5 (five) minutes as needed for chest pain. 30 tablet 0   omeprazole (PRILOSEC) 20 MG capsule TAKE 1 CAPSULE BY MOUTH TWICE DAILY 30  MINUTES BEFORE BREAKFAST AND DINNER FOR ACID  REFLUX. 60 capsule 11   tamsulosin (FLOMAX) 0.4 MG CAPS capsule Take 0.4 mg by mouth.     aspirin EC 81 MG tablet Take 81 mg by mouth daily. Swallow whole.     metoprolol succinate (TOPROL-XL) 25 MG 24 hr tablet Take 1 tablet (25 mg total) by mouth daily. 30 tablet 0   HYDROcodone-acetaminophen (NORCO) 5-325 MG tablet Take 1-2 tablets by mouth every 6 (six) hours as needed for moderate pain. 20 tablet 0    sulfamethoxazole-trimethoprim (BACTRIM DS) 800-160 MG tablet Take 1 tablet by mouth daily. 11 tablet 0   No facility-administered medications prior to visit.     Allergies:   Codeine   Social History   Socioeconomic History   Marital status: Divorced    Spouse name: Not on file   Number of children: 3   Years of education: Not on file   Highest education level: Not on file  Occupational History   Occupation: disabled  Tobacco Use   Smoking status: Former    Packs/day: 0.50    Years: 5.00    Total pack years: 2.50    Types: Cigarettes    Quit date: 06/03/1993    Years since quitting: 28.7   Smokeless tobacco: Never  Vaping Use   Vaping Use: Never used  Substance and Sexual Activity   Alcohol use: No   Drug use: No   Sexual activity: Yes    Comment: moderate ED  Other Topics Concern   Not on file  Social History Narrative   Not on file   Social Determinants of Health   Financial Resource Strain: Not on file  Food Insecurity: Not on file  Transportation Needs: Not on file  Physical Activity: Not on file  Stress: Not on file  Social Connections: Not on file     Family History:  The patient's family history includes Lung cancer in his father.   Review of Systems:    Please see the history of present illness.     All other systems reviewed and are otherwise negative except as noted above.   Physical Exam:    VS:  BP 124/68   Pulse (!) 136   Ht '6\' 1"'$  (1.854 m)   Wt 210 lb (95.3 kg)   SpO2 97%   BMI 27.71 kg/m    General: Well developed, well nourished,male appearing in no acute distress. Head: Normocephalic, atraumatic. Neck: No carotid bruits. JVD not elevated.  Lungs: Respirations regular and unlabored, without wheezes or rales.  Heart: Irregularly irregular. No S3 or S4.  No murmur, no rubs, or gallops appreciated. Abdomen: Appears non-distended. No obvious abdominal masses. Msk:  Strength and tone appear normal for age. No obvious joint deformities  or effusions. Extremities: No clubbing or cyanosis. No pitting edema.  Distal pedal pulses are 2+ bilaterally. Neuro: Alert and oriented X 3. Moves all extremities spontaneously. No focal deficits noted. Psych:  Responds to questions appropriately with a normal affect. Skin: No rashes or lesions noted  Wt Readings from Last 3 Encounters:  03/07/22 210 lb (95.3 kg)  10/06/20 222 lb (100.7 kg)  10/04/20 220 lb (99.8 kg)     Studies/Labs Reviewed:   EKG:  EKG is ordered today. The ekg ordered today demonstrates atrial fibrillation with RVR,  HR 136.  Recent Labs: No results found for requested labs within last 365 days.   Lipid Panel No results found for: "CHOL", "TRIG", "HDL", "CHOLHDL", "VLDL", "LDLCALC", "LDLDIRECT"  Additional studies/ records that were reviewed today include:   Echocardiogram: 09/2020 IMPRESSIONS     1. Left ventricular ejection fraction, by estimation, is 60 to 65%. The  left ventricle has normal function. The left ventricle has no regional  wall motion abnormalities. Left ventricular diastolic parameters are  indeterminate.   2. Right ventricular systolic function is normal. The right ventricular  size is normal.   3. Left atrial size was mildly dilated.   4. The mitral valve is normal in structure. Trivial mitral valve  regurgitation.   5. The aortic valve is normal in structure. Aortic valve regurgitation is  not visualized.   Assessment:    1. Persistent atrial fibrillation ([place])   2. Essential hypertension   3. Gastroesophageal reflux disease, unspecified whether esophagitis present      Plan:   In order of problems listed above:  1. Persistent Atrial Fibrillation - He reports intermittent palpitations and dizziness for the past 6+ months and was in atrial fibrillation with RVR at the time of his office visit with his PCP last month. EKG today shows atrial fibrillation with RVR, heart rate 136 but his heart rate did improve into the 90's to  low 100's on recheck. I suspect his rhythm is more persistent at this time. Will obtain a repeat echocardiogram for reassessment of any structural abnormalities as he is at risk for a tachycardia-induced cardiomyopathy. He did have recent labs with his PCP and we will request a copy of these.  - Will plan to titrate Toprol-XL from 25 mg daily to 50 mg daily. Will start Xarelto 20 mg daily for anticoagulation (prefer this over Eliquis given that he only takes medications once daily and this will hopefully help to improve compliance) given his CHA2DS2-VASc Score of at least 2. Stop ASA.  - Will arrange for a nurse visit in 2 weeks and if still in atrial fibrillation, would arrange for DCCV. Risks and benefits of the procedure were reviewed with the patient today. Will send a note to Dr. Harrington Challenger as well to make sure she is in agreement with this plan. - He does drag race vehicles and I encouraged him not to do so until his heart rate improves.  2. HTN - BP is at 124/68 during today's visit. Will titrate Toprol-XL from 25 mg daily to 50 mg daily for improved rate control.  3. GERD - Remains on Omeprazole 20 mg daily.   Medication Adjustments/Labs and Tests Ordered: Current medicines are reviewed at length with the patient today.  Concerns regarding medicines are outlined above.  Medication changes, Labs and Tests ordered today are listed in the Patient Instructions below. Patient Instructions  Medication Instructions:  Your physician has recommended you make the following change in your medication:  -Stop Aspirin -Start Xarelto 20 mg tablets daily -Increase Toprol XL to 50 mg tablets daily   Labwork: None  Testing/Procedures: Your physician has requested that you have an echocardiogram. Echocardiography is a painless test that uses sound waves to create images of your heart. It provides your doctor with information about the size and shape of your heart and how well your heart's chambers and  valves are working. This procedure takes approximately one hour. There are no restrictions for this procedure. Please do NOT wear cologne, perfume, aftershave, or lotions (deodorant is  allowed). Please arrive 15 minutes prior to your appointment time.   Follow-Up: Follow up with Nurse in 2 weeks for EKG.  Follow up with [facility]/Dr. Harrington Challenger in 6-8 weeks.   Any Other Special Instructions Will Be Listed Below (If Applicable).     If you need a refill on your cardiac medications before your next appointment, please call your pharmacy.    Signed, Erma Heritage, PA-C  03/07/2022 4:25 PM    [place] [address] [place], [place] [address] Phone: [phone] Fax: [phone]

## 2022-03-07 NOTE — Patient Instructions (Addendum)
Medication Instructions:  Your physician has recommended you make the following change in your medication:  -Stop Aspirin -Start Xarelto 20 mg tablets daily -Increase Toprol XL to 50 mg tablets daily   Labwork: None  Testing/Procedures: Your physician has requested that you have an echocardiogram. Echocardiography is a painless test that uses sound waves to create images of your heart. It provides your doctor with information about the size and shape of your heart and how well your heart's chambers and valves are working. This procedure takes approximately one hour. There are no restrictions for this procedure. Please do NOT wear cologne, perfume, aftershave, or lotions (deodorant is allowed). Please arrive 15 minutes prior to your appointment time.   Follow-Up: Follow up with Nurse in 2 weeks for EKG.  Follow up with APP/Dr. Harrington Challenger in 6-8 weeks.   Any Other Special Instructions Will Be Listed Below (If Applicable).     If you need a refill on your cardiac medications before your next appointment, please call your pharmacy.

## 2022-03-13 ENCOUNTER — Telehealth: Payer: Self-pay | Admitting: Student

## 2022-03-13 NOTE — Telephone Encounter (Signed)
Pt states he is returning a call from our office, did not see a note, unsure who might have called.

## 2022-03-13 NOTE — Telephone Encounter (Signed)
Spoke to pt who verbalized agreement to move nurse appt to 11/21 @ 4:15 d/t provider not being on the office.

## 2022-03-19 ENCOUNTER — Ambulatory Visit: Payer: Medicare Other | Attending: Cardiology | Admitting: *Deleted

## 2022-03-19 VITALS — BP 108/56 | HR 88

## 2022-03-19 DIAGNOSIS — I4819 Other persistent atrial fibrillation: Secondary | ICD-10-CM

## 2022-03-19 NOTE — Progress Notes (Unsigned)
Hold Toprol XL the day of DCCV.

## 2022-03-20 ENCOUNTER — Ambulatory Visit: Payer: Medicare Other

## 2022-03-20 ENCOUNTER — Ambulatory Visit (HOSPITAL_COMMUNITY): Payer: Medicare Other

## 2022-03-20 ENCOUNTER — Encounter: Payer: Self-pay | Admitting: *Deleted

## 2022-03-20 NOTE — Progress Notes (Signed)
    EKG reviewed during nurse visit and discussed with patient. HR has significantly improved and he reports his prior dizziness has resolved. Reports good compliance with his medications and has not missed any doses of Xarelto. Will plan for DCCV after 03/28/2022. Risks and benefits previously discussed and Dr. Harrington Challenger is in agreement with this plan as well. Will obtain his echo prior to DCCV.   Signed, Erma Heritage, PA-C 03/20/2022, 7:39 AM

## 2022-03-27 ENCOUNTER — Ambulatory Visit (HOSPITAL_COMMUNITY)
Admission: RE | Admit: 2022-03-27 | Discharge: 2022-03-27 | Disposition: A | Discharge status: Home / Self Care | Payer: Medicare Other | Source: Ambulatory Visit | Attending: Student | Admitting: Student

## 2022-03-27 DIAGNOSIS — I4819 Other persistent atrial fibrillation: Secondary | ICD-10-CM | POA: Insufficient documentation

## 2022-03-27 LAB — ECHOCARDIOGRAM COMPLETE
Area-P 1/2: 3.85 cm2
S' Lateral: 2.7 cm

## 2022-03-27 NOTE — Progress Notes (Signed)
*  PRELIMINARY RESULTS* Echocardiogram 2D Echocardiogram has been performed.  Douglas Sims 03/27/2022, 12:50 PM

## 2022-03-27 NOTE — Patient Instructions (Signed)
Douglas Sims  03/27/2022     '@PREFPERIOPPHARMACY'$ @   Your procedure is scheduled on  04/02/2022.   Report to [facility] at  [facility]   Call this number if you have problems the morning of surgery:  [phone]  If you experience any cold or flu symptoms such as cough, fever, chills, shortness of breath, etc. between now and your scheduled surgery, please notify us at the above number.   Remember:  Do not eat or drink after midnight.        DO NOT miss any doses of your xarelto before your procedure.     Take these medicines the morning of surgery with A SIP OF WATER     xanax(if needed), mobic (if needed), omeprazole, flomax.     Do not wear jewelry, make-up or nail polish.  Do not wear lotions, powders, or perfumes, or deodorant.  Do not shave 48 hours prior to surgery.  Men may shave face and neck.  Do not bring valuables to the hospital.  [facility] is not responsible for any belongings or valuables.  Contacts, dentures or bridgework may not be worn into surgery.  Leave your suitcase in the car.  After surgery it may be brought to your room.  For patients admitted to the hospital, discharge time will be determined by your treatment team.  Patients discharged the day of surgery will not be allowed to drive home and must have someone with them for 24 hours.    Special instructions:   DO NOT smoke tobacco or vape for 24 hours before your procedure.  Please read over the following fact sheets that you were given. Anesthesia Post-op Instructions and Care and Recovery After Surgery      Electrical Cardioversion Electrical cardioversion is the delivery of a jolt of electricity to restore a normal rhythm to the heart. A rhythm that is too fast or is not regular keeps the heart from pumping well. There are two kinds of cardioversion. Pharmacologic (chemical) cardioversion is when your health care provider gives you one or more medicines to bring back your  regular heartbeat. The second way to restore regular rhythms is by sending an electrical shock to the heart. This is called electrical cardioversion. Electrical cardioversion is done as a scheduled procedure for irregular or fast heart rhythms that are not immediately life-threatening. Electrical cardioversion may also be done in an emergency for sudden life-threatening arrhythmias. Tell a health care provider about: Any allergies you have. All medicines you are taking, including vitamins, herbs, eye drops, creams, and over-the-counter medicines. Any problems you or family members have had with anesthetic medicines. Any bleeding problems you have. Any surgeries you have had, including a pacemaker, defibrillator, or other implanted device. Any medical conditions you have. Whether you are pregnant or may be pregnant. What are the risks? Your health care provider will talk with you about risks. These include: Allergic reactions to medicines. Irritation to the skin on your chest or back where the electrodes were applied during electrical cardioversion. A blood clot that breaks free and travels to other parts of your body, such as your brain. The possible return of a worse abnormal heart rhythm that will need to be treated with medicines, a pacemaker, or an implantable cardioverter defibrillator (ICD). What happens before the procedure? Medicines Your health care provider may have you start taking: Blood-thinning medicines (anticoagulants) so your blood does not clot as easily. If your doctor gives  you this medicine, you may need to take it for 3 to 4 weeks before the procedure. Medicines to help stabilize your heart rate and rhythm. Ask your health care provider about: Changing or stopping your regular medicines. These include any diabetes medicines or blood thinners you take. Taking medicines such as aspirin and ibuprofen. These medicines can thin your blood. Do not take them unless your health  care provider tells you to. Taking over-the-counter medicines, vitamins, herbs, and supplements. General instructions Follow instructions from your health care provider about what you may eat and drink. Do not put any lotions, powders, or ointments on your chest and back for 24 hours before the procedure. They can cause problems with the paddles used to deliver electricity to your heart. Do not wear jewelry as this can interfere with delivering electricity to your heart. If you will be going home right after the procedure, plan to have a responsible adult: Take you home from the hospital or clinic. You will not be allowed to drive. Care for you for the time you are told. Tests You may have an exam or testing. A transesophageal echocardiogram (TEE) may be performed if necessary (or preferred) to rule out left atrial blood clot. What happens during the procedure?     An IV will be inserted into one of your veins. Sticky patches (electrodes) or metal paddles may be placed on your chest. The electrodes or paddles will be placed in one of these ways: One on your right chest, the other on the left ribs. One may be placed on your chest and the other on your back. Or both can be placed on your chest. You may be given a sedative. This helps you relax. An electrical shock will be delivered. The shock briefly stops (resets) your heart rhythm. Your health care provider will check to see if your heartbeat is regular. Some people need only one shock. Some need more to restore a regular heartbeat. The procedure may vary among health care providers and hospitals. What happens after the procedure? Your blood pressure, heart rate, breathing rate, and blood oxygen level will be monitored until you leave the hospital or clinic. Your heart rhythm will be watched to make sure it does not change. Summary Electrical cardioversion is the delivery of a jolt of electricity to restore a normal rhythm to the  heart. This procedure may be done as a scheduled procedure if the condition is not an emergency. You may have irritation to the skin on your chest or back where the electrodes were applied during the procedure. Your health care provider will check to see if your heartbeat is regular. Some people need only one shock. Some need more to restore a regular heartbeat. Your blood pressure, heart rate, breathing rate, and blood oxygen level will be monitored until you leave the hospital or clinic. This information is not intended to replace advice given to you by your health care provider. Make sure you discuss any questions you have with your health care provider. Document Revised: 08/07/2021 Document Reviewed: 08/09/2021 Elsevier Patient Education  [place] After The following information offers guidance on how to care for yourself after your procedure. Your health care provider may also give you more specific instructions. If you have problems or questions, contact your health care provider. What can I expect after the procedure? After the procedure, it is common to have: Tiredness. Little or no memory about what happened during or after the procedure.  Impaired judgment when it comes to making decisions. Nausea or vomiting. Some trouble with balance. Follow these instructions at home: For the time period you were told by your health care provider:  Rest. Do not participate in activities where you could fall or become injured. Do not drive or use machinery. Do not drink alcohol. Do not take sleeping pills or medicines that cause drowsiness. Do not make important decisions or sign legal documents. Do not take care of children on your own. Medicines Take over-the-counter and prescription medicines only as told by your health care provider. If you were prescribed antibiotics, take them as told by your health care provider. Do not stop using the  antibiotic even if you start to feel better. Eating and drinking Follow instructions from your health care provider about what you may eat and drink. Drink enough fluid to keep your urine pale yellow. If you vomit: Drink clear fluids slowly and in small amounts as you are able. Clear fluids include water, ice chips, low-calorie sports drinks, and fruit juice that has water added to it (diluted fruit juice). Eat light and bland foods in small amounts as you are able. These foods include bananas, applesauce, rice, lean meats, toast, and crackers. General instructions  Have a responsible adult stay with you for the time you are told. It is important to have someone help care for you until you are awake and alert. If you have sleep apnea, surgery and some medicines can increase your risk for breathing problems. Follow instructions from your health care provider about wearing your sleep device: When you are sleeping. This includes during daytime naps. While taking prescription pain medicines, sleeping medicines, or medicines that make you drowsy. Do not use any products that contain nicotine or tobacco. These products include cigarettes, chewing tobacco, and vaping devices, such as e-cigarettes. If you need help quitting, ask your health care provider. Contact a health care provider if: You feel nauseous or vomit every time you eat or drink. You feel light-headed. You are still sleepy or having trouble with balance after 24 hours. You get a rash. You have a fever. You have redness or swelling around the IV site. Get help right away if: You have trouble breathing. You have new confusion after you get home. These symptoms may be an emergency. Get help right away. Call 911. Do not wait to see if the symptoms will go away. Do not drive yourself to the hospital. This information is not intended to replace advice given to you by your health care provider. Make sure you discuss any questions you have  with your health care provider. Document Revised: 09/10/2021 Document Reviewed: 09/10/2021 Elsevier Patient Education  [place].

## 2022-03-28 ENCOUNTER — Encounter (HOSPITAL_COMMUNITY)
Admission: RE | Admit: 2022-03-28 | Discharge: 2022-03-28 | Disposition: A | Discharge status: Home / Self Care | Payer: Medicare Other | Source: Ambulatory Visit | Attending: Cardiology | Admitting: Cardiology

## 2022-03-28 ENCOUNTER — Encounter (HOSPITAL_COMMUNITY): Payer: Self-pay

## 2022-03-28 DIAGNOSIS — I4819 Other persistent atrial fibrillation: Secondary | ICD-10-CM | POA: Insufficient documentation

## 2022-03-28 DIAGNOSIS — Z01812 Encounter for preprocedural laboratory examination: Secondary | ICD-10-CM | POA: Insufficient documentation

## 2022-03-28 HISTORY — DX: Cardiac arrhythmia, unspecified: I49.9

## 2022-03-28 LAB — CBC WITH DIFFERENTIAL/PLATELET
Abs Immature Granulocytes: 0.01 10*3/uL (ref 0.00–0.07)
Basophils Absolute: 0.1 10*3/uL (ref 0.0–0.1)
Basophils Relative: 1 %
Eosinophils Absolute: 0.2 10*3/uL (ref 0.0–0.5)
Eosinophils Relative: 2 %
HCT: 50 % (ref 39.0–52.0)
Hemoglobin: 17.4 g/dL — ABNORMAL HIGH (ref 13.0–17.0)
Immature Granulocytes: 0 %
Lymphocytes Relative: 20 %
Lymphs Abs: 1.4 10*3/uL (ref 0.7–4.0)
MCH: 30.4 pg (ref 26.0–34.0)
MCHC: 34.8 g/dL (ref 30.0–36.0)
MCV: 87.3 fL (ref 80.0–100.0)
Monocytes Absolute: 0.5 10*3/uL (ref 0.1–1.0)
Monocytes Relative: 8 %
Neutro Abs: 4.7 10*3/uL (ref 1.7–7.7)
Neutrophils Relative %: 69 %
Platelets: 166 10*3/uL (ref 150–400)
RBC: 5.73 MIL/uL (ref 4.22–5.81)
RDW: 13.1 % (ref 11.5–15.5)
WBC: 6.8 10*3/uL (ref 4.0–10.5)
nRBC: 0 % (ref 0.0–0.2)

## 2022-03-28 LAB — BASIC METABOLIC PANEL
Anion gap: 10 (ref 5–15)
BUN: 17 mg/dL (ref 8–23)
CO2: 22 mmol/L (ref 22–32)
Calcium: 8.9 mg/dL (ref 8.9–10.3)
Chloride: 104 mmol/L (ref 98–111)
Creatinine, Ser: 0.96 mg/dL (ref 0.61–1.24)
GFR, Estimated: >60 mL/min (ref >60)
Glucose, Bld: 121 mg/dL — ABNORMAL HIGH (ref 70–99)
Potassium: 4.1 mmol/L (ref 3.5–5.1)
Sodium: 136 mmol/L (ref 135–145)

## 2022-03-28 NOTE — Pre-Procedure Instructions (Signed)
Douglas Sims at [facility] because patient did not show for his pre-op this morning.

## 2022-04-02 ENCOUNTER — Ambulatory Visit (HOSPITAL_COMMUNITY)
Admission: RE | Admit: 2022-04-02 | Discharge: 2022-04-02 | Disposition: A | Discharge status: Home / Self Care | Payer: Medicare Other | Attending: Cardiology | Admitting: Cardiology

## 2022-04-02 ENCOUNTER — Encounter (HOSPITAL_COMMUNITY)
Admission: RE | Disposition: A | Discharge status: Home / Self Care | Payer: Self-pay | Source: Home / Self Care | Attending: Cardiology

## 2022-04-02 ENCOUNTER — Ambulatory Visit (HOSPITAL_COMMUNITY): Payer: Medicare Other | Admitting: Anesthesiology

## 2022-04-02 ENCOUNTER — Encounter (HOSPITAL_COMMUNITY): Payer: Self-pay | Admitting: Cardiology

## 2022-04-02 ENCOUNTER — Ambulatory Visit (HOSPITAL_BASED_OUTPATIENT_CLINIC_OR_DEPARTMENT_OTHER): Payer: Medicare Other | Admitting: Anesthesiology

## 2022-04-02 DIAGNOSIS — F419 Anxiety disorder, unspecified: Secondary | ICD-10-CM | POA: Diagnosis not present

## 2022-04-02 DIAGNOSIS — Z79899 Other long term (current) drug therapy: Secondary | ICD-10-CM | POA: Insufficient documentation

## 2022-04-02 DIAGNOSIS — Z87891 Personal history of nicotine dependence: Secondary | ICD-10-CM | POA: Diagnosis not present

## 2022-04-02 DIAGNOSIS — Z8546 Personal history of malignant neoplasm of prostate: Secondary | ICD-10-CM | POA: Diagnosis not present

## 2022-04-02 DIAGNOSIS — I4819 Other persistent atrial fibrillation: Secondary | ICD-10-CM | POA: Diagnosis not present

## 2022-04-02 DIAGNOSIS — M1990 Unspecified osteoarthritis, unspecified site: Secondary | ICD-10-CM | POA: Insufficient documentation

## 2022-04-02 DIAGNOSIS — K219 Gastro-esophageal reflux disease without esophagitis: Secondary | ICD-10-CM | POA: Diagnosis not present

## 2022-04-02 DIAGNOSIS — I4891 Unspecified atrial fibrillation: Secondary | ICD-10-CM | POA: Diagnosis not present

## 2022-04-02 DIAGNOSIS — I10 Essential (primary) hypertension: Secondary | ICD-10-CM

## 2022-04-02 HISTORY — PX: CARDIOVERSION: SHX1299

## 2022-04-02 SURGERY — CARDIOVERSION
Anesthesia: General

## 2022-04-02 MED ORDER — CHLORHEXIDINE GLUCONATE 0.12 % MT SOLN
15.0000 mL | Freq: Once | OROMUCOSAL | Status: AC
  Administered 2022-04-02: 15 mL via OROMUCOSAL
  Filled 2022-04-02: qty 15

## 2022-04-02 MED ORDER — LACTATED RINGERS IV SOLN: INTRAVENOUS | Status: DC

## 2022-04-02 MED ORDER — ORAL CARE MOUTH RINSE: 15.0000 mL | Freq: Once | OROMUCOSAL | Status: AC

## 2022-04-02 MED ORDER — PROPOFOL 10 MG/ML IV BOLUS
INTRAVENOUS | Status: DC | PRN
  Administered 2022-04-02: 70 mg via INTRAVENOUS

## 2022-04-02 NOTE — Anesthesia Postprocedure Evaluation (Signed)
Anesthesia Post Note  Patient: Douglas Sims  Procedure(s) Performed: CARDIOVERSION  Patient location during evaluation: Phase II Anesthesia Type: General Level of consciousness: awake and alert and oriented Pain management: pain level controlled Vital Signs Assessment: post-procedure vital signs reviewed and stable Respiratory status: spontaneous breathing, nonlabored ventilation and respiratory function stable Cardiovascular status: blood pressure returned to baseline and stable Postop Assessment: no apparent nausea or vomiting Anesthetic complications: no  No notable events documented.   Last Vitals:  Vitals:   04/02/22 1100 04/02/22 1123  BP: 117/84 (!) 145/88  Pulse: (!) 47 72  Resp: (!) 21 20  Temp:  36.7 C  SpO2: 98% 98%    Last Pain:  Vitals:   04/02/22 1123  TempSrc: Oral  PainSc: 0-No pain                 Mathews Stuhr C Anina Schnake

## 2022-04-02 NOTE — Transfer of Care (Signed)
Immediate Anesthesia Transfer of Care Note  Patient: Douglas Sims  Procedure(s) Performed: CARDIOVERSION  Patient Location: PACU  Anesthesia Type:MAC  Level of Consciousness: awake and alert   Airway & Oxygen Therapy: Patient Spontanous Breathing and Patient connected to nasal cannula oxygen  Post-op Assessment: Report given to RN and Post -op Vital signs reviewed and stable  Post vital signs: Reviewed and stable  Last Vitals:  Vitals Value Taken Time  BP    Temp    Pulse    Resp    SpO2      Last Pain:  Vitals:   04/02/22 0757  TempSrc: Oral  PainSc:          Complications: No notable events documented.

## 2022-04-02 NOTE — CV Procedure (Signed)
CV Procedure Note  Procedure: Electrical cardioversion Physician: Dr Carlyle Dolly MD Indication: persistent atrial fibrillation   Patient was brought to the procedure area after appropriate consent was obtained. I confirmed with patient he had not missed any doses of xarelto within the last 3 weeks. Defib pads placed in the anterior and posterior positions. Sedation achieved with the assistance of anesthesiology, for details please see there documentation. Succesfully cardioverted from afib to sinus rhythm initially mid 50s increased to 70 with a single synchronized 200j shock. Cardiopulmonary monitoring performed throughout the procedue, he tolerated well without complications   Carlyle Dolly MD

## 2022-04-02 NOTE — Anesthesia Preprocedure Evaluation (Signed)
Anesthesia Evaluation  Patient identified by MRN, date of birth, ID band Patient awake    Reviewed: Allergy & Precautions, H&P , NPO status , Patient's Chart, lab work & pertinent test results, reviewed documented beta blocker date and time   Airway Mallampati: III  TM Distance: >3 FB Neck ROM: Full    Dental  (+) Dental Advisory Given, Missing   Pulmonary neg pulmonary ROS, former smoker   Pulmonary exam normal breath sounds clear to auscultation       Cardiovascular Exercise Tolerance: Good hypertension, Pt. on medications and Pt. on home beta blockers + dysrhythmias Atrial Fibrillation  Rhythm:Irregular Rate:Tachycardia     Neuro/Psych  PSYCHIATRIC DISORDERS Anxiety     negative neurological ROS     GI/Hepatic Neg liver ROS,GERD  Medicated and Controlled,,  Endo/Other  negative endocrine ROS    Renal/GU negative Renal ROS  negative genitourinary   Musculoskeletal  (+) Arthritis , Osteoarthritis,    Abdominal   Peds negative pediatric ROS (+)  Hematology negative hematology ROS (+)   Anesthesia Other Findings   Reproductive/Obstetrics negative OB ROS                             Anesthesia Physical Anesthesia Plan  ASA: 3  Anesthesia Plan: General   Post-op Pain Management: Minimal or no pain anticipated   Induction: Intravenous  PONV Risk Score and Plan: 0 and Propofol infusion  Airway Management Planned: Nasal Cannula and Natural Airway  Additional Equipment:   Intra-op Plan:   Post-operative Plan:   Informed Consent: I have reviewed the patients History and Physical, chart, labs and discussed the procedure including the risks, benefits and alternatives for the proposed anesthesia with the patient or authorized representative who has indicated his/her understanding and acceptance.     Dental advisory given  Plan Discussed with: CRNA and Surgeon  Anesthesia Plan  Comments:        Anesthesia Quick Evaluation

## 2022-04-02 NOTE — H&P (Signed)
CV Procedure H&P  Patient presents for outpatient electrical cardioversion for atrial fibrillation. Please see referenced recent clinic note below for full medical history. Plan for electrical cardioversion today with the assistance of anesthesiology  Carlyle Dolly MD    Cardiology Office Note     Date:  03/07/2022    [name], DOB 18-Apr-1956, MRN [id number]   PCP:  Redmond School, MD   Cardiologist: Dorris Carnes, MD         Chief Complaint  Patient presents with   Follow-up      Atrial Fibrillation      History of Present Illness:     Douglas Sims is a 66 y.o. male with past medical history of paroxysmal atrial fibrillation, GERD and history of prostate cancer who presents to the office today for follow-up of atrial fibrillation.   He was examined by Dr. Harrington Challenger in 09/2020 as a new patient referral after recent EKG had shown atrial fibrillation. He denied any associated symptoms and reported he typically would walk for 5 miles per day without angina or palpitations. He had converted back to normal sinus rhythm and was continued on Toprol-XL 25 mg daily. It does not appear he was on anticoagulation given his CHA2DS2-VASc score of 1. Echocardiogram showed a preserved EF of 60 to 65% with no regional wall motion abnormalities and only trivial MR. He did have a sleep study which showed borderline minimal OSA and it was recommended to consider an in lab study but he declined.   In talking with the patient today, he reports intermittent palpitations for the past 6+ months.  Says this can occur at rest or with activity and he experiences intermittent lightheadedness as well. Denies any associated chest pain. Does report intermittent dyspnea on exertion. He does drag race vehicles. No recent orthopnea, PND or pitting edema. Says he was evaluated by his PCP over a month ago and was found to be in recurrent atrial fibrillation which prompted him to schedule a follow-up visit.          Past Medical History:  Diagnosis Date   Anxiety     Arthritis     Bladder cancer (HCC)     GERD (gastroesophageal reflux disease)     PAF (paroxysmal atrial fibrillation) (HCC)     Prostate cancer (HCC)     Wears glasses             Past Surgical History:  Procedure Laterality Date   BLADDER SURGERY       COLONOSCOPY       COLONOSCOPY N/A 01/16/2016    Procedure: COLONOSCOPY;  Surgeon: Danie Binder, MD;  [facility];  Service: Endoscopy;  Laterality: N/A;  230   FASCIECTOMY Left 06/07/2014    Procedure: LEFT PALM FASCIECTOMY LEFT RING FINGER;  Surgeon: Daryll Brod, MD;  Location: [facility];  Service: Orthopedics;  Laterality: Left;   LYMPH NODE DISSECTION Bilateral 10/06/2020    Procedure: LIMITED LYMPH NODE DISSECTION;  Surgeon: [name], MD;  [facility];  Service: Urology;  Laterality: Bilateral;   PLANTAR FASCIA SURGERY   2007    left foot   PROSTATE BIOPSY       ROBOT ASSISTED LAPAROSCOPIC RADICAL PROSTATECTOMY N/A 10/06/2020    Procedure: XI ROBOTIC ASSISTED LAPAROSCOPIC RADICAL PROSTATECTOMY;  Surgeon: [name], MD;  [facility];  Service: Urology;  Laterality: N/A;   TONSILLECTOMY       UPPER GI ENDOSCOPY  Current Medications:       Outpatient Medications Prior to Visit  Medication Sig Dispense Refill   ALPRAZolam (XANAX) 1 MG tablet Take 1 mg by mouth 3 (three) times daily.       docusate sodium (COLACE) 100 MG capsule Take 1 capsule (100 mg total) by mouth 2 (two) times daily.       meloxicam (MOBIC) 7.5 MG tablet Take 7.5 mg by mouth in the morning and at bedtime.       nitroGLYCERIN (NITROSTAT) 0.4 MG SL tablet Place 1 tablet (0.4 mg total) under the tongue every 5 (five) minutes as needed for chest pain. 30 tablet 0   omeprazole (PRILOSEC) 20 MG capsule TAKE 1 CAPSULE BY MOUTH TWICE DAILY 30  MINUTES BEFORE BREAKFAST AND DINNER FOR ACID  REFLUX. 60 capsule 11   tamsulosin (FLOMAX) 0.4 MG CAPS capsule Take  0.4 mg by mouth.       aspirin EC 81 MG tablet Take 81 mg by mouth daily. Swallow whole.       metoprolol succinate (TOPROL-XL) 25 MG 24 hr tablet Take 1 tablet (25 mg total) by mouth daily. 30 tablet 0   HYDROcodone-acetaminophen (NORCO) 5-325 MG tablet Take 1-2 tablets by mouth every 6 (six) hours as needed for moderate pain. 20 tablet 0   sulfamethoxazole-trimethoprim (BACTRIM DS) 800-160 MG tablet Take 1 tablet by mouth daily. 11 tablet 0    No facility-administered medications prior to visit.      Allergies:   Codeine    Social History         Socioeconomic History   Marital status: Divorced      Spouse name: Not on file   Number of children: 3   Years of education: Not on file   Highest education level: Not on file  Occupational History   Occupation: disabled  Tobacco Use   Smoking status: Former      Packs/day: 0.50      Years: 5.00      Total pack years: 2.50      Types: Cigarettes      Quit date: 06/03/1993      Years since quitting: 28.7   Smokeless tobacco: Never  Vaping Use   Vaping Use: Never used  Substance and Sexual Activity   Alcohol use: No   Drug use: No   Sexual activity: Yes      Comment: moderate ED  Other Topics Concern   Not on file  Social History Narrative   Not on file    Social Determinants of Health    Financial Resource Strain: Not on file  Food Insecurity: Not on file  Transportation Needs: Not on file  Physical Activity: Not on file  Stress: Not on file  Social Connections: Not on file      Family History:  The patient's family history includes Lung cancer in his father.    Review of Systems:     Please see the history of present illness.      All other systems reviewed and are otherwise negative except as noted above.     Physical Exam:     VS:  BP 124/68   Pulse (!) 136   Ht '6\' 1"'$  (1.854 m)   Wt 210 lb (95.3 kg)   SpO2 97%   BMI 27.71 kg/m    General: Well developed, well nourished,male appearing in no acute  distress. Head: Normocephalic, atraumatic. Neck: No carotid bruits. JVD not elevated.  Lungs: Respirations regular  and unlabored, without wheezes or rales.  Heart: Irregularly irregular. No S3 or S4.  No murmur, no rubs, or gallops appreciated. Abdomen: Appears non-distended. No obvious abdominal masses. Msk:  Strength and tone appear normal for age. No obvious joint deformities or effusions. Extremities: No clubbing or cyanosis. No pitting edema.  Distal pedal pulses are 2+ bilaterally. Neuro: Alert and oriented X 3. Moves all extremities spontaneously. No focal deficits noted. Psych:  Responds to questions appropriately with a normal affect. Skin: No rashes or lesions noted      Wt Readings from Last 3 Encounters:  03/07/22 210 lb (95.3 kg)  10/06/20 222 lb (100.7 kg)  10/04/20 220 lb (99.8 kg)      Studies/Labs Reviewed:    EKG:  EKG is ordered today. The ekg ordered today demonstrates atrial fibrillation with RVR, HR 136.   Recent Labs: No results found for requested labs within last 365 days.    Lipid Panel Labs (Brief)  No results found for: "CHOL", "TRIG", "HDL", "CHOLHDL", "VLDL", "LDLCALC", "LDLDIRECT"     Additional studies/ records that were reviewed today include:    Echocardiogram: 09/2020 IMPRESSIONS     1. Left ventricular ejection fraction, by estimation, is 60 to 65%. The  left ventricle has normal function. The left ventricle has no regional  wall motion abnormalities. Left ventricular diastolic parameters are  indeterminate.   2. Right ventricular systolic function is normal. The right ventricular  size is normal.   3. Left atrial size was mildly dilated.   4. The mitral valve is normal in structure. Trivial mitral valve  regurgitation.   5. The aortic valve is normal in structure. Aortic valve regurgitation is  not visualized.    Assessment:     1. Persistent atrial fibrillation ([place])   2. Essential hypertension   3. Gastroesophageal reflux  disease, unspecified whether esophagitis present         Plan:    In order of problems listed above:   1. Persistent Atrial Fibrillation - He reports intermittent palpitations and dizziness for the past 6+ months and was in atrial fibrillation with RVR at the time of his office visit with his PCP last month. EKG today shows atrial fibrillation with RVR, heart rate 136 but his heart rate did improve into the 90's to low 100's on recheck. I suspect his rhythm is more persistent at this time. Will obtain a repeat echocardiogram for reassessment of any structural abnormalities as he is at risk for a tachycardia-induced cardiomyopathy. He did have recent labs with his PCP and we will request a copy of these.  - Will plan to titrate Toprol-XL from 25 mg daily to 50 mg daily. Will start Xarelto 20 mg daily for anticoagulation (prefer this over Eliquis given that he only takes medications once daily and this will hopefully help to improve compliance) given his CHA2DS2-VASc Score of at least 2. Stop ASA.  - Will arrange for a nurse visit in 2 weeks and if still in atrial fibrillation, would arrange for DCCV. Risks and benefits of the procedure were reviewed with the patient today. Will send a note to Dr. Harrington Challenger as well to make sure she is in agreement with this plan. - He does drag race vehicles and I encouraged him not to do so until his heart rate improves.   2. HTN - BP is at 124/68 during today's visit. Will titrate Toprol-XL from 25 mg daily to 50 mg daily for improved rate control.   3.  GERD - Remains on Omeprazole 20 mg daily.     Medication Adjustments/Labs and Tests Ordered: Current medicines are reviewed at length with the patient today.  Concerns regarding medicines are outlined above.  Medication changes, Labs and Tests ordered today are listed in the Patient Instructions below. Patient Instructions  Medication Instructions:  Your physician has recommended you make the following change in  your medication:  -Stop Aspirin -Start Xarelto 20 mg tablets daily -Increase Toprol XL to 50 mg tablets daily     Labwork: None   Testing/Procedures: Your physician has requested that you have an echocardiogram. Echocardiography is a painless test that uses sound waves to create images of your heart. It provides your doctor with information about the size and shape of your heart and how well your heart's chambers and valves are working. This procedure takes approximately one hour. There are no restrictions for this procedure. Please do NOT wear cologne, perfume, aftershave, or lotions (deodorant is allowed). Please arrive 15 minutes prior to your appointment time.     Follow-Up: Follow up with Nurse in 2 weeks for EKG.  Follow up with [facility]/Dr. Harrington Challenger in 6-8 weeks.    Any Other Special Instructions Will Be Listed Below (If Applicable).         If you need a refill on your cardiac medications before your next appointment, please call your pharmacy.     Signed, Erma Heritage, PA-C  03/07/2022 4:25 PM    [facility] [address] [place],  [address] Phone: [phone] Fax: [phone]

## 2022-04-08 ENCOUNTER — Encounter (HOSPITAL_COMMUNITY): Payer: Self-pay | Admitting: Cardiology

## 2022-04-23 ENCOUNTER — Encounter: Payer: Self-pay | Admitting: Medical

## 2022-04-23 ENCOUNTER — Ambulatory Visit: Payer: Medicare Other | Attending: Medical | Admitting: Medical

## 2022-04-23 VITALS — BP 136/94 | HR 98 | Ht 73.0 in | Wt 223.0 lb

## 2022-04-23 DIAGNOSIS — I4819 Other persistent atrial fibrillation: Secondary | ICD-10-CM

## 2022-04-23 DIAGNOSIS — I10 Essential (primary) hypertension: Secondary | ICD-10-CM | POA: Diagnosis not present

## 2022-04-23 MED ORDER — METOPROLOL SUCCINATE ER 50 MG PO TB24: 75.0000 mg | ORAL_TABLET | Freq: Every day | ORAL | 3 refills | Status: AC

## 2022-04-23 NOTE — Patient Instructions (Addendum)
Medication Instructions:  INCREASE Toprol to 75 mg daily  Labwork: None today  Testing/Procedures: None today  Follow-Up: 3 months in [place]  Any Other Special Instructions Will Be Listed Below (If Applicable).   Electrophysiology Consult on Tuesday,05/21/22 at 2:15 pm with Dr.Greg Lovena Le in the [facility] office. Please arrive 15 minutes prior for check-in.  If you need a refill on your cardiac medications before your next appointment, please call your pharmacy.

## 2022-04-23 NOTE — Progress Notes (Signed)
Cardiology Office Note:    Date:  04/23/2022   [name], DOB [birth year], MRN [id number]  PCP:  Redmond School, MD  [facility] Cardiologist:  Dorris Carnes, MD  [place] Electrophysiologist:  None   Referring MD: Redmond School, MD   Chief Complaint: 6-8 week follow-up  History of Present Illness:    Douglas Sims is a 66 y.o. male with a hx of  paroxysmal atrial fibrillation, GERD and history of prostate cancer who presents to the office today for follow-up of atrial fibrillation.   He was examined by Dr. Harrington Challenger in 09/2020 as a new patient referral after recent EKG had shown atrial fibrillation. He denied any associated symptoms and reported he typically would walk for 5 miles per day without angina or palpitations. He had converted back to normal sinus rhythm and was continued on Toprol-XL 25 mg daily. It does not appear he was on anticoagulation given his CHA2DS2-VASc score of 1. Echocardiogram showed a preserved EF of 60 to 65% with no regional wall motion abnormalities and only trivial MR. He did have a sleep study which showed borderline minimal OSA and it was recommended to consider an in lab study but he declined.  Last seen 03/07/22 and reported palpitations. He was still in afib with controlled rates. Toprol was increased. An echo was ordered. He remained in afib despite increase in BB, and he was ultimately set up for cardioversion. The patient underwent successful cardioversion 04/02/22.   Today, the patient is back in afib with HR 113bpm. He lost his mom yesterday, this has been stressful for the patient. He denies any symptoms. No chest pain, SOB, lightheadedness, or dizziness. No lower leg edema. He reports compliance with Xarelto. Echo was reviewed.  Past Medical History:  Diagnosis Date   Anxiety    Arthritis    Atrial fibrillation (HCC)    Bladder cancer (HCC)    Dysrhythmia    GERD (gastroesophageal reflux disease)    Hypertension    PAF (paroxysmal  atrial fibrillation) ([place])    Prostate cancer ([place])    Wears glasses     Past Surgical History:  Procedure Laterality Date   BLADDER SURGERY     CARDIOVERSION N/A 04/02/2022   Procedure: CARDIOVERSION;  Surgeon: Arnoldo Lenis, MD;  [facility];  Service: Endoscopy;  Laterality: N/A;   COLONOSCOPY     COLONOSCOPY N/A 01/16/2016   Procedure: COLONOSCOPY;  Surgeon: Danie Binder, MD;  [facility];  Service: Endoscopy;  Laterality: N/A;  230   FASCIECTOMY Left 06/07/2014   Procedure: LEFT PALM FASCIECTOMY LEFT RING FINGER;  Surgeon: Daryll Brod, MD;  Location: [facility];  Service: Orthopedics;  Laterality: Left;   LYMPH NODE DISSECTION Bilateral 10/06/2020   Procedure: LIMITED LYMPH NODE DISSECTION;  Surgeon: [name], MD;  [facility];  Service: Urology;  Laterality: Bilateral;   PLANTAR FASCIA SURGERY  2007   left foot   PROSTATE BIOPSY     ROBOT ASSISTED LAPAROSCOPIC RADICAL PROSTATECTOMY N/A 10/06/2020   Procedure: XI ROBOTIC ASSISTED LAPAROSCOPIC RADICAL PROSTATECTOMY;  Surgeon: [name], MD;  [facility];  Service: Urology;  Laterality: N/A;   TONSILLECTOMY     UPPER GI ENDOSCOPY      Current Medications: Current Meds  Medication Sig   ALPRAZolam (XANAX) 1 MG tablet Take 1 mg by mouth 3 (three) times daily.   docusate sodium (COLACE) 100 MG capsule Take 1 capsule (100 mg total) by  mouth 2 (two) times daily. (Patient taking differently: Take 100 mg by mouth daily as needed for mild constipation or moderate constipation.)   meloxicam (MOBIC) 7.5 MG tablet Take 7.5 mg by mouth daily. Additional 7.5 mg if needed during the day   metoprolol succinate (TOPROL-XL) 50 MG 24 hr tablet Take 1.5 tablets (75 mg total) by mouth daily. Take with or immediately following a meal.   nitroGLYCERIN (NITROSTAT) 0.4 MG SL tablet Place 1 tablet (0.4 mg total) under the tongue every 5 (five) minutes as needed for chest pain.   omeprazole (PRILOSEC)  20 MG capsule TAKE 1 CAPSULE BY MOUTH TWICE DAILY 30  MINUTES BEFORE BREAKFAST AND DINNER FOR ACID  REFLUX. (Patient taking differently: Take 20 mg by mouth every morning. 30  MINUTES BEFORE BREAKFAST  FOR ACID  REFLUX.)   rivaroxaban (XARELTO) 20 MG TABS tablet Take 1 tablet (20 mg total) by mouth daily with supper. (Patient taking differently: Take 20 mg by mouth every morning.)   traZODone (DESYREL) 50 MG tablet Take 50 mg by mouth at bedtime as needed for sleep.   [DISCONTINUED] metoprolol succinate (TOPROL-XL) 50 MG 24 hr tablet Take 1 tablet (50 mg total) by mouth daily. Take with or immediately following a meal.     Allergies:   Codeine   Social History   Socioeconomic History   Marital status: Divorced    Spouse name: Not on file   Number of children: 3   Years of education: Not on file   Highest education level: Not on file  Occupational History   Occupation: disabled  Tobacco Use   Smoking status: Former    Packs/day: 0.50    Years: 5.00    Total pack years: 2.50    Types: Cigarettes    Quit date: 06/03/1993    Years since quitting: 28.9   Smokeless tobacco: Never  Vaping Use   Vaping Use: Never used  Substance and Sexual Activity   Alcohol use: Yes    Comment: occ.   Drug use: No   Sexual activity: Yes    Comment: moderate ED  Other Topics Concern   Not on file  Social History Narrative   Not on file   Social Determinants of Health   Financial Resource Strain: Not on file  Food Insecurity: Not on file  Transportation Needs: Not on file  Physical Activity: Not on file  Stress: Not on file  Social Connections: Not on file     Family History: The patient's family history includes Lung cancer in his father. There is no history of Breast cancer, Pancreatic cancer, Colon cancer, or Prostate cancer.  ROS:   Please see the history of present illness.     All other systems reviewed and are negative.  EKGs/Labs/Other Studies Reviewed:    The following studies  were reviewed today:  Echo 03/27/22 1. Left ventricular ejection fraction, by estimation, is 60 to 65%. The  left ventricle has normal function. The left ventricle has no regional  wall motion abnormalities. Left ventricular diastolic function could not  be evaluated.   2. Right ventricular systolic function is mildly reduced. The right  ventricular size is mildly enlarged.   3. Right atrial size was mildly dilated.   4. The mitral valve is grossly normal. No evidence of mitral valve  regurgitation. No evidence of mitral stenosis. Moderate mitral annular  calcification.   5. The aortic valve is grossly normal. Aortic valve regurgitation is not  visualized. No aortic stenosis  is present.   Comparison(s): No significant change from prior study.    EKG:  EKG is ordered today.  The ekg ordered today demonstrates aFIB 113BPM, no ST/T wave changes  Recent Labs: 03/28/2022: BUN 17; Creatinine, Ser 0.96; Hemoglobin 17.4; Platelets 166; Potassium 4.1; Sodium 136  Recent Lipid Panel No results found for: "CHOL", "TRIG", "HDL", "CHOLHDL", "VLDL", "LDLCALC", "LDLDIRECT"   Physical Exam:    VS:  BP (!) 136/94   Pulse 98   Ht '6\' 1"'$  (1.854 m)   Wt 223 lb (101.2 kg)   SpO2 98%   BMI 29.42 kg/m     Wt Readings from Last 3 Encounters:  04/23/22 223 lb (101.2 kg)  04/02/22 212 lb 4.9 oz (96.3 kg)  03/28/22 212 lb 3.3 oz (96.3 kg)     GEN:  Well nourished, well developed in no acute distress HEENT: Normal NECK: No JVD; No carotid bruits LYMPHATICS: No lymphadenopathy CARDIAC: Irreg Irreg, tachy, no murmurs, rubs, gallops RESPIRATORY:  Clear to auscultation without rales, wheezing or rhonchi  ABDOMEN: Soft, non-tender, non-distended MUSCULOSKELETAL:  No edema; No deformity  SKIN: Warm and dry NEUROLOGIC:  Alert and oriented x 3 PSYCHIATRIC:  Normal affect   ASSESSMENT:    1. Persistent atrial fibrillation ([place])   2. Essential hypertension    PLAN:    In order of problems  listed above:  Persistent Afib Recent successful cardioversion, however he is back in Afib today with a heart rate of 113bpm. The patient is completely asymptomatic. He is under a lot of stress with recent passing of his mother. He reports compliance with Xarelto '20mg'$  daily. I will increase Toprol to '75mg'$  daily and refer him to EP. Echo showed normal LVEF, mildly reduced RVSF.   HTN BP is good today, increase Toprol as above.   Disposition: Follow up in 3 month(s) with MD/APP     Signed, Pheobe Sandiford Ninfa Meeker, PA-C  04/23/2022 3:57 PM    [facility]

## 2022-05-21 ENCOUNTER — Encounter: Payer: Self-pay | Admitting: Internal Medicine

## 2022-05-21 ENCOUNTER — Ambulatory Visit: Payer: Medicare Other | Attending: Internal Medicine | Admitting: Internal Medicine

## 2022-05-21 VITALS — BP 132/80 | HR 111 | Ht 73.0 in | Wt 223.0 lb

## 2022-05-21 DIAGNOSIS — I4819 Other persistent atrial fibrillation: Secondary | ICD-10-CM

## 2022-05-21 DIAGNOSIS — I4891 Unspecified atrial fibrillation: Secondary | ICD-10-CM | POA: Insufficient documentation

## 2022-05-21 DIAGNOSIS — I10 Essential (primary) hypertension: Secondary | ICD-10-CM | POA: Diagnosis not present

## 2022-05-21 MED ORDER — AMIODARONE HCL 200 MG PO TABS: 200.0000 mg | ORAL_TABLET | Freq: Two times a day (BID) | ORAL | 3 refills | Status: AC

## 2022-05-21 NOTE — Progress Notes (Signed)
HPI Douglas Sims is referred today by Cadence Furth for evaluation of uncontrolled atrial fib. He is a pleasant 67 yo man with a h/o HTN who developed atrial fib after an episode of heat exhaustion. He has undergone rate control, initiation of systemic anti-coag and then DCCV. He had ERAF. He has not had syncope. He does not feel palpitations but notes malaise and weakness and dyspnea with exertion. No anginal symptoms.  Allergies  Allergen Reactions   Codeine Itching     Current Outpatient Medications  Medication Sig Dispense Refill   ALPRAZolam (XANAX) 1 MG tablet Take 1 mg by mouth 3 (three) times daily.     amiodarone (PACERONE) 200 MG tablet Take 1 tablet (200 mg total) by mouth 2 (two) times daily. 90 tablet 3   docusate sodium (COLACE) 100 MG capsule Take 1 capsule (100 mg total) by mouth 2 (two) times daily. (Patient taking differently: Take 100 mg by mouth daily as needed for mild constipation or moderate constipation.)     meloxicam (MOBIC) 7.5 MG tablet Take 7.5 mg by mouth daily. Additional 7.5 mg if needed during the day     metoprolol succinate (TOPROL-XL) 50 MG 24 hr tablet Take 1.5 tablets (75 mg total) by mouth daily. Take with or immediately following a meal. 135 tablet 3   nitroGLYCERIN (NITROSTAT) 0.4 MG SL tablet Place 1 tablet (0.4 mg total) under the tongue every 5 (five) minutes as needed for chest pain. 30 tablet 0   omeprazole (PRILOSEC) 20 MG capsule TAKE 1 CAPSULE BY MOUTH TWICE DAILY 30  MINUTES BEFORE BREAKFAST AND DINNER FOR ACID  REFLUX. (Patient taking differently: Take 20 mg by mouth every morning. 30  MINUTES BEFORE BREAKFAST  FOR ACID  REFLUX.) 60 capsule 11   rivaroxaban (XARELTO) 20 MG TABS tablet Take 1 tablet (20 mg total) by mouth daily with supper. (Patient taking differently: Take 20 mg by mouth every morning.) 30 tablet 6   tamsulosin (FLOMAX) 0.4 MG CAPS capsule Take 0.4 mg by mouth daily after breakfast.     traZODone (DESYREL) 50 MG tablet  Take 50 mg by mouth at bedtime as needed for sleep.     No current facility-administered medications for this visit.     Past Medical History:  Diagnosis Date   Anxiety    Arthritis    Atrial fibrillation (HCC)    Bladder cancer (HCC)    Dysrhythmia    GERD (gastroesophageal reflux disease)    Hypertension    PAF (paroxysmal atrial fibrillation) ([place])    Prostate cancer (HCC)    Wears glasses     ROS:   All systems reviewed and negative except as noted in the HPI.   Past Surgical History:  Procedure Laterality Date   BLADDER SURGERY     CARDIOVERSION N/A 04/02/2022   Procedure: CARDIOVERSION;  Surgeon: Arnoldo Lenis, MD;  Location: [facility];  Service: Endoscopy;  Laterality: N/A;   COLONOSCOPY     COLONOSCOPY N/A 01/16/2016   Procedure: COLONOSCOPY;  Surgeon: Danie Binder, MD;  [facility];  Service: Endoscopy;  Laterality: N/A;  230   FASCIECTOMY Left 06/07/2014   Procedure: LEFT PALM FASCIECTOMY LEFT RING FINGER;  Surgeon: Daryll Brod, MD;  Location: [place];  Service: Orthopedics;  Laterality: Left;   LYMPH NODE DISSECTION Bilateral 10/06/2020   Procedure: LIMITED LYMPH NODE DISSECTION;  Surgeon: [name], MD;  Location: [facility];  Service: Urology;  Laterality: Bilateral;  PLANTAR FASCIA SURGERY  2007   left foot   PROSTATE BIOPSY     ROBOT ASSISTED LAPAROSCOPIC RADICAL PROSTATECTOMY N/A 10/06/2020   Procedure: XI ROBOTIC ASSISTED LAPAROSCOPIC RADICAL PROSTATECTOMY;  Surgeon: [name], MD;  Location: [facility];  Service: Urology;  Laterality: N/A;   TONSILLECTOMY     UPPER GI ENDOSCOPY       Family History  Problem Relation Age of Onset   Lung cancer Father    Breast cancer Neg Hx    Pancreatic cancer Neg Hx    Colon cancer Neg Hx    Prostate cancer Neg Hx      Social History   Socioeconomic History   Marital status: Divorced    Spouse name: Not on file   Number of children: 3   Years of education: Not on file    Highest education level: Not on file  Occupational History   Occupation: disabled  Tobacco Use   Smoking status: Former    Packs/day: 0.50    Years: 5.00    Total pack years: 2.50    Types: Cigarettes    Quit date: 06/03/1993    Years since quitting: 28.9   Smokeless tobacco: Never  Vaping Use   Vaping Use: Never used  Substance and Sexual Activity   Alcohol use: Yes    Comment: occ.   Drug use: No   Sexual activity: Yes    Comment: moderate ED  Other Topics Concern   Not on file  Social History Narrative   Not on file   Social Determinants of Health   Financial Resource Strain: Not on file  Food Insecurity: Not on file  Transportation Needs: Not on file  Physical Activity: Not on file  Stress: Not on file  Social Connections: Not on file  Intimate Partner Violence: Not on file     BP 132/80   Pulse (!) 111   Ht '6\' 1"'$  (1.854 m)   Wt 223 lb (101.2 kg)   SpO2 98%   BMI 29.42 kg/m   Physical Exam:  Well appearing NAD HEENT: Unremarkable Neck:  No JVD, no thyromegally Lymphatics:  No adenopathy Back:  No CVA tenderness Lungs:  Clear HEART:  Regular rate rhythm, no murmurs, no rubs, no clicks Abd:  soft, positive bowel sounds, no organomegally, no rebound, no guarding Ext:  2 plus pulses, no edema, no cyanosis, no clubbing Skin:  No rashes no nodules Neuro:  CN II through XII intact, motor grossly intact  EKG - afib with an RVR  Assess/Plan: Uncontrolled persistent atrial fib - I have reviewed the treatment options with the patient. I have recommended initiation of amiodarone followed by repeat DCCV. I will see him back in 8 weeks. Long term, I would expect to keep him on amiodarone for a couple of months maintaining NSR and the plan for afib ablation.  HTN -his bp is controlled. No change in his meds. He will remain on toprol for bp and HR control.  3. Coags - he will continue xarelto.   Carleene Overlie Aster Eckrich,MD

## 2022-05-21 NOTE — Patient Instructions (Signed)
Medication Instructions:  Your physician has recommended you make the following change in your medication: Amiodarone 200 mg Amiodarone- You will:   Take 1 tablet (200 mg total) by mouth 2 (two) times daily.   Lab Work: None ordered.  If you have labs (blood work) drawn today and your tests are completely normal, you will receive your results only by: [place] (if you have MyChart) OR A paper copy in the mail If you have any lab test that is abnormal or we need to change your treatment, we will call you to review the results.  Testing/Procedures: A cardioversion has been ordered by Dr. Cristopher Peru 6 weeks from today.    Follow-Up:  Please schedule a follow up appointment with Dr. Lovena Le in [place] [facility] in 8 - 10 weeks.       Dear Douglas Sims  You are scheduled for a Cardioversion on Monday, March 4 with Dr. Fransico Him.  Please arrive at the [facility] (Main Entrance A) at [facility]: [address] [place], [place] [address] at 730 am.     DIET:  Nothing to eat or drink after midnight except a sip of water with medications (see medication instructions below)  MEDICATION INSTRUCTIONS: NO MORNING MEDICATIONS  Continue taking your anticoagulant (blood thinner): Rivaroxaban (Xarelto).  You will need to continue this after your procedure until you are told by your provider that it is safe to stop.    LABS:   You will have labs done the day of your procedure.    FYI:  For your safety, and to allow [place] to monitor your vital signs accurately during the surgery/procedure we request: If you have artificial nails, gel coating, SNS etc, please have those removed prior to your surgery/procedure. Not having the nail coverings /polish removed may result in cancellation or delay of your surgery/procedure.  You must have a responsible person to drive you home and stay in the waiting area during your procedure. Failure to do so could result in cancellation.  Bring  your insurance cards.  *Special Note: Every effort is made to have your procedure done on time. Occasionally there are emergencies that occur at the hospital that may cause delays. Please be patient if a delay does occur.        Amiodarone Tablets What is this medication? AMIODARONE (a MEE oh da rone) prevents and treats a fast or irregular heartbeat (arrhythmia). It works by slowing down overactive electric signals in the heart, which stabilizes your heart rhythm. It belongs to a group of medications called antiarrhythmics. This medicine may be used for other purposes; ask your health care provider or pharmacist if you have questions. COMMON BRAND NAME(S): Cordarone, Pacerone What should I tell my care team before I take this medication? They need to know if you have any of these conditions: Liver disease Lung disease Other heart problems Thyroid disease An unusual or allergic reaction to amiodarone, iodine, other medications, foods, dyes, or preservatives Pregnant or trying to get pregnant Breast-feeding How should I use this medication? Take this medication by mouth with water. Take it as directed on the prescription label at the same time every day. You can take it with or without food. You should always take it the same way. Keep taking it unless your care team tells you to stop. A special MedGuide will be given to you by the pharmacist with each prescription and refill. Be sure to read this information carefully each time. Talk to  your care team about the use of this medication in children. Special care may be needed. Overdosage: If you think you have taken too much of this medicine contact a poison control center or emergency room at once. NOTE: This medicine is only for you. Do not share this medicine with others. What if I miss a dose? If you miss a dose, take it as soon as you can. If it is almost time for your next dose, take only that dose. Do not take double or extra  doses. What may interact with this medication? Do not take this medication with any of the following: Abarelix Apomorphine Arsenic trioxide Certain antibiotics, such as erythromycin, gemifloxacin, levofloxacin, or pentamidine Certain medications for depression, such as amoxapine or tricyclic antidepressants Certain medications for fungal infections, such as fluconazole, itraconazole, ketoconazole, posaconazole, or voriconazole Certain medications for irregular heartbeat, such as disopyramide, dronedarone, ibutilide, propafenone, or sotalol Certain medications for malaria, such as chloroquine or halofantrine Cisapride Droperidol Haloperidol [place] Maprotiline Methadone Phenothiazines, such as chlorpromazine, mesoridazine, or thioridazine Pimozide Ranolazine Red yeast rice Vardenafil This medication may also interact with the following: Antivirals for HIV Certain medications for blood pressure, heart disease, irregular heartbeat Certain medications for cholesterol, such as atorvastatin, cerivastatin, lovastatin, or simvastatin Certain medications for hepatitis C, such as sofosbuvir and ledipasvir; sofosbuvir Certain medications for seizures, such as phenytoin Certain medications for thyroid problems Certain medications that prevent or treat blood clots, such as warfarin Cholestyramine Cimetidine Clopidogrel Cyclosporine Dextromethorphan Diuretics Dofetilide Fentanyl General anesthetics Grapefruit juice Lidocaine Loratadine Methotrexate Other medications that cause heart rhythm changes Procainamide Quinidine Rifabutin, rifampin, or rifapentine St. John's Wort Trazodone Ziprasidone This list may not describe all possible interactions. Give your health care provider a list of all the medicines, herbs, non-prescription drugs, or dietary supplements you use. Also tell them if you smoke, drink alcohol, or use illegal drugs. Some items may interact with your medicine. What  should I watch for while using this medication? Your condition will be monitored closely when you first begin therapy. This medication is often started in a hospital or other monitored health care setting. Once you are on maintenance therapy, visit your care team for regular checks on your progress. Because your condition and use of this medication carry some risk, it is a good idea to carry an identification card, necklace, or bracelet with details of your condition, medications, and care team. This medication may affect your coordination, reaction time, or judgment. Do not drive or operate machinery until you know how this medication affects you. Sit up or stand slowly to reduce the risk of dizzy or fainting spells. Drinking alcohol with this medication can increase the risk of these side effects. This medication can make you more sensitive to the sun. Keep out of the sun. If you cannot avoid being in the sun, wear protective clothing and sunscreen. Do not use sun lamps, tanning beds, or tanning booths. You should have regular eye exams before and during treatment. Call your care team if you have blurred vision, see halos, or your eyes become sensitive to light. Your eyes may get dry. It may be helpful to use a lubricating eye solution or artificial tears solution. If you are going to have surgery or a procedure that requires contrast dyes, tell your care team that you are taking this medication. What side effects may I notice from receiving this medication? Side effects that you should report to your care team as soon as possible: Allergic reactions--skin rash, itching,  hives, swelling of the face, lips, tongue, or throat Bluish-gray skin Change in vision such as blurry vision, seeing halos around lights, vision loss Heart failure--shortness of breath, swelling of the ankles, feet, or hands, sudden weight gain, unusual weakness or fatigue Heart rhythm changes--fast or irregular heartbeat, dizziness,  feeling faint or lightheaded, chest pain, trouble breathing High thyroid levels (hyperthyroidism)--fast or irregular heartbeat, weight loss, excessive sweating or sensitivity to heat, tremors or shaking, anxiety, nervousness, irregular menstrual cycle or spotting Liver injury--right upper belly pain, loss of appetite, nausea, light-colored stool, dark yellow or brown urine, yellowing skin or eyes, unusual weakness or fatigue Low thyroid levels (hypothyroidism)--unusual weakness or fatigue, sensitivity to cold, constipation, hair loss, dry skin, weight gain, feelings of depression Lung injury--shortness of breath or trouble breathing, cough, spitting up blood, chest pain, fever Pain, tingling, or numbness in the hands or feet, muscle weakness, trouble walking, loss of balance or coordination Side effects that usually do not require medical attention (report to your care team if they continue or are bothersome): Nausea Vomiting This list may not describe all possible side effects. Call your doctor for medical advice about side effects. You may report side effects to FDA at 1-800-FDA-1088. Where should I keep my medication? Keep out of the reach of children and pets. Store at room temperature between 20 and 25 degrees C (68 and 77 degrees F). Protect from light. Keep container tightly closed. Throw away any unused medication after the expiration date. NOTE: This sheet is a summary. It may not cover all possible information. If you have questions about this medicine, talk to your doctor, pharmacist, or health care provider.  2023 Elsevier/Gold Standard (2020-06-09 00:00:00)

## 2022-07-01 ENCOUNTER — Encounter (HOSPITAL_COMMUNITY): Payer: Self-pay | Admitting: Certified Registered"

## 2022-07-01 ENCOUNTER — Encounter (HOSPITAL_COMMUNITY)
Admission: RE | Disposition: A | Discharge status: Home / Self Care | Payer: Self-pay | Source: Home / Self Care | Attending: Cardiology

## 2022-07-01 ENCOUNTER — Ambulatory Visit (HOSPITAL_COMMUNITY)
Admission: RE | Admit: 2022-07-01 | Discharge: 2022-07-01 | Disposition: A | Discharge status: Home / Self Care | Payer: Medicare Other | Attending: Cardiology | Admitting: Cardiology

## 2022-07-01 DIAGNOSIS — N2889 Other specified disorders of kidney and ureter: Secondary | ICD-10-CM

## 2022-07-01 DIAGNOSIS — Z86010 Personal history of colonic polyps: Secondary | ICD-10-CM

## 2022-07-01 DIAGNOSIS — I4891 Unspecified atrial fibrillation: Secondary | ICD-10-CM

## 2022-07-01 DIAGNOSIS — D090 Carcinoma in situ of bladder: Secondary | ICD-10-CM

## 2022-07-01 DIAGNOSIS — N401 Enlarged prostate with lower urinary tract symptoms: Secondary | ICD-10-CM

## 2022-07-01 DIAGNOSIS — A048 Other specified bacterial intestinal infections: Secondary | ICD-10-CM

## 2022-07-01 DIAGNOSIS — R1314 Dysphagia, pharyngoesophageal phase: Secondary | ICD-10-CM

## 2022-07-01 DIAGNOSIS — I4819 Other persistent atrial fibrillation: Secondary | ICD-10-CM

## 2022-07-01 DIAGNOSIS — N486 Induration penis plastica: Secondary | ICD-10-CM

## 2022-07-01 DIAGNOSIS — R972 Elevated prostate specific antigen [PSA]: Secondary | ICD-10-CM

## 2022-07-01 DIAGNOSIS — K824 Cholesterolosis of gallbladder: Secondary | ICD-10-CM

## 2022-07-01 DIAGNOSIS — C61 Malignant neoplasm of prostate: Secondary | ICD-10-CM

## 2022-07-01 DIAGNOSIS — Z539 Procedure and treatment not carried out, unspecified reason: Secondary | ICD-10-CM | POA: Insufficient documentation

## 2022-07-01 DIAGNOSIS — K769 Liver disease, unspecified: Secondary | ICD-10-CM

## 2022-07-01 DIAGNOSIS — R918 Other nonspecific abnormal finding of lung field: Secondary | ICD-10-CM

## 2022-07-01 DIAGNOSIS — C672 Malignant neoplasm of lateral wall of bladder: Secondary | ICD-10-CM

## 2022-07-01 SURGERY — CANCELLED PROCEDURE

## 2022-07-01 NOTE — Anesthesia Preprocedure Evaluation (Signed)
Anesthesia Evaluation    Reviewed: Allergy & Precautions, Patient's Chart, lab work & pertinent test results  Airway        Dental   Pulmonary neg pulmonary ROS, former smoker          Cardiovascular hypertension, Pt. on home beta blockers + dysrhythmias Atrial Fibrillation      Neuro/Psych negative neurological ROS     GI/Hepatic Neg liver ROS,GERD  Medicated,,  Endo/Other  negative endocrine ROS    Renal/GU negative Renal ROS     Musculoskeletal negative musculoskeletal ROS (+)    Abdominal   Peds  Hematology  (+) Blood dyscrasia (Xarelto)   Anesthesia Other Findings A-FIB  Reproductive/Obstetrics                             Anesthesia Physical Anesthesia Plan  ASA:   Anesthesia Plan: General   Post-op Pain Management:    Induction:   PONV Risk Score and Plan:   Airway Management Planned:   Additional Equipment:   Intra-op Plan:   Post-operative Plan:   Informed Consent:   Plan Discussed with:   Anesthesia Plan Comments:        Anesthesia Quick Evaluation

## 2022-07-01 NOTE — Progress Notes (Signed)
Patient presented for cardioversion but is in NSR. Will discharge home on current medications.  Gwyndolyn Kaufman, MD

## 2022-07-01 NOTE — Progress Notes (Signed)
Pt in sinus bradycardia in endo preop. Confirmed with 12lead EKG. Dr. Johney Frame made aware. DCCV canceled.

## 2022-07-08 DIAGNOSIS — C678 Malignant neoplasm of overlapping sites of bladder: Secondary | ICD-10-CM | POA: Diagnosis not present

## 2022-07-08 DIAGNOSIS — D518 Other vitamin B12 deficiency anemias: Secondary | ICD-10-CM | POA: Diagnosis not present

## 2022-07-08 DIAGNOSIS — M1991 Primary osteoarthritis, unspecified site: Secondary | ICD-10-CM | POA: Diagnosis not present

## 2022-07-08 DIAGNOSIS — R7309 Other abnormal glucose: Secondary | ICD-10-CM | POA: Diagnosis not present

## 2022-07-08 DIAGNOSIS — I700 Atherosclerosis of aorta: Secondary | ICD-10-CM | POA: Diagnosis not present

## 2022-07-08 DIAGNOSIS — Z0001 Encounter for general adult medical examination with abnormal findings: Secondary | ICD-10-CM | POA: Diagnosis not present

## 2022-07-08 DIAGNOSIS — E782 Mixed hyperlipidemia: Secondary | ICD-10-CM | POA: Diagnosis not present

## 2022-07-08 DIAGNOSIS — I4811 Longstanding persistent atrial fibrillation: Secondary | ICD-10-CM | POA: Diagnosis not present

## 2022-07-08 DIAGNOSIS — D509 Iron deficiency anemia, unspecified: Secondary | ICD-10-CM | POA: Diagnosis not present

## 2022-07-08 DIAGNOSIS — M1990 Unspecified osteoarthritis, unspecified site: Secondary | ICD-10-CM | POA: Diagnosis not present

## 2022-07-08 DIAGNOSIS — E559 Vitamin D deficiency, unspecified: Secondary | ICD-10-CM | POA: Diagnosis not present

## 2022-07-08 DIAGNOSIS — K219 Gastro-esophageal reflux disease without esophagitis: Secondary | ICD-10-CM | POA: Diagnosis not present

## 2022-07-08 DIAGNOSIS — G9332 Myalgic encephalomyelitis/chronic fatigue syndrome: Secondary | ICD-10-CM | POA: Diagnosis not present

## 2022-07-22 DIAGNOSIS — C678 Malignant neoplasm of overlapping sites of bladder: Secondary | ICD-10-CM | POA: Diagnosis not present

## 2022-07-23 ENCOUNTER — Encounter: Payer: Self-pay | Admitting: Internal Medicine

## 2022-07-23 ENCOUNTER — Ambulatory Visit: Payer: Medicare Other | Attending: Internal Medicine | Admitting: Internal Medicine

## 2022-07-23 VITALS — BP 132/80 | HR 76 | Ht 73.0 in | Wt 225.0 lb

## 2022-07-23 DIAGNOSIS — I4891 Unspecified atrial fibrillation: Secondary | ICD-10-CM | POA: Diagnosis not present

## 2022-07-23 NOTE — Progress Notes (Signed)
HPI Mr. Valdivia returns for ongoing evaluation of uncontrolled atrial fib. He is a pleasant 67 yo man with a h/o HTN who developed atrial fib after an episode of heat exhaustion. He has undergone rate control, initiation of systemic anti-coag and then DCCV. He had ERAF. He has not had syncope. He does not feel palpitations but notes malaise and weakness in atrial fib.  When I saw him last, he was started on amiodarone in anticipation of undergoing DCCV on amio with ultimate plan to undergo PVI. In the interim, he notes he feels better.  Allergies  Allergen Reactions   Codeine Itching     Current Outpatient Medications  Medication Sig Dispense Refill   ALPRAZolam (XANAX) 1 MG tablet Take 1 mg by mouth 3 (three) times daily.     amiodarone (PACERONE) 200 MG tablet Take 1 tablet (200 mg total) by mouth 2 (two) times daily. 90 tablet 3   docusate sodium (COLACE) 100 MG capsule Take 1 capsule (100 mg total) by mouth 2 (two) times daily. (Patient taking differently: Take 100 mg by mouth daily as needed for mild constipation or moderate constipation.)     levothyroxine (SYNTHROID) 50 MCG tablet Take 50 mcg by mouth daily.     meloxicam (MOBIC) 7.5 MG tablet Take 15 mg by mouth daily.     metoprolol succinate (TOPROL-XL) 50 MG 24 hr tablet Take 1.5 tablets (75 mg total) by mouth daily. Take with or immediately following a meal. (Patient taking differently: Take 50 mg by mouth 2 (two) times daily. Take with or immediately following a meal.) 135 tablet 3   nitroGLYCERIN (NITROSTAT) 0.4 MG SL tablet Place 1 tablet (0.4 mg total) under the tongue every 5 (five) minutes as needed for chest pain. 30 tablet 0   omeprazole (PRILOSEC) 20 MG capsule TAKE 1 CAPSULE BY MOUTH TWICE DAILY 30  MINUTES BEFORE BREAKFAST AND DINNER FOR ACID  REFLUX. (Patient taking differently: Take 20 mg by mouth 2 (two) times daily before a meal. 30  MINUTES BEFORE BREAKFAST  FOR ACID  REFLUX.) 60 capsule 11   rivaroxaban  (XARELTO) 20 MG TABS tablet Take 1 tablet (20 mg total) by mouth daily with supper. (Patient taking differently: Take 20 mg by mouth every morning.) 30 tablet 6   traZODone (DESYREL) 50 MG tablet Take 50 mg by mouth at bedtime as needed for sleep.     No current facility-administered medications for this visit.     Past Medical History:  Diagnosis Date   Anxiety    Arthritis    Atrial fibrillation (HCC)    Bladder cancer (HCC)    Dysrhythmia    GERD (gastroesophageal reflux disease)    Hypertension    PAF (paroxysmal atrial fibrillation) ([place])    Prostate cancer (HCC)    Wears glasses     ROS:   All systems reviewed and negative except as noted in the HPI.   Past Surgical History:  Procedure Laterality Date   BLADDER SURGERY     CARDIOVERSION N/A 04/02/2022   Procedure: CARDIOVERSION;  Surgeon: Arnoldo Lenis, MD;  Location[facility];  Service: Endoscopy;  Laterality: N/A;   COLONOSCOPY     COLONOSCOPY N/A 01/16/2016   Procedure: COLONOSCOPY;  Surgeon: Danie Binder, MD;  [facility];  Service: Endoscopy;  Laterality: N/A;  230   FASCIECTOMY Left 06/07/2014   Procedure: LEFT PALM FASCIECTOMY LEFT RING FINGER;  Surgeon: Daryll Brod, MD;  Location: [facility];  Service: Orthopedics;  Laterality: Left;   LYMPH NODE DISSECTION Bilateral 10/06/2020   Procedure: LIMITED LYMPH NODE DISSECTION;  Surgeon: [name], MD;  Location[facility];  Service: Urology;  Laterality: Bilateral;   PLANTAR FASCIA SURGERY  2007   left foot   PROSTATE BIOPSY     ROBOT ASSISTED LAPAROSCOPIC RADICAL PROSTATECTOMY N/A 10/06/2020   Procedure: XI ROBOTIC ASSISTED LAPAROSCOPIC RADICAL PROSTATECTOMY;  Surgeon: [name], MD;  [facility];  Service: Urology;  Laterality: N/A;   TONSILLECTOMY     UPPER GI ENDOSCOPY       Family History  Problem Relation Age of Onset   Lung cancer Father    Breast cancer Neg Hx    Pancreatic cancer Neg Hx    Colon cancer Neg Hx     Prostate cancer Neg Hx      Social History   Socioeconomic History   Marital status: Divorced    Spouse name: Not on file   Number of children: 3   Years of education: Not on file   Highest education level: Not on file  Occupational History   Occupation: disabled  Tobacco Use   Smoking status: Former    Packs/day: 0.50    Years: 5.00    Additional pack years: 0.00    Total pack years: 2.50    Types: Cigarettes    Quit date: 06/03/1993    Years since quitting: 29.1   Smokeless tobacco: Never  Vaping Use   Vaping Use: Never used  Substance and Sexual Activity   Alcohol use: Yes    Comment: occ.   Drug use: No   Sexual activity: Yes    Comment: moderate ED  Other Topics Concern   Not on file  Social History Narrative   Not on file   Social Determinants of Health   Financial Resource Strain: Not on file  Food Insecurity: Not on file  Transportation Needs: Not on file  Physical Activity: Not on file  Stress: Not on file  Social Connections: Not on file  Intimate Partner Violence: Not on file     BP 132/80   Pulse 76   Ht 6\' 1"  (1.854 m)   Wt 225 lb (102.1 kg)   SpO2 97%   BMI 29.69 kg/m   Physical Exam:  Well appearing NAD HEENT: Unremarkable Neck:  No JVD, no thyromegally Lymphatics:  No adenopathy Back:  No CVA tenderness Lungs:  Clear with no wheezes HEART:  Regular rate rhythm, no murmurs, no rubs, no clicks Abd:  soft, positive bowel sounds, no organomegally, no rebound, no guarding Ext:  2 plus pulses, no edema, no cyanosis, no clubbing Skin:  No rashes no nodules Neuro:  CN II through XII intact, motor grossly intact  EKG - atrial flutter with a CVR  DEVICE  Normal device function.  See PaceArt for details.   Assess/Plan:  Uncontrolled persistent atrial fib - he is s/p DCV and on amiodarone he has developed typical atrial flutter.  Atrial flutter - his rates are controlled. As he desires to come off of amio long term, he will continue  the amio, and I will refer him to Dr. Myles Gip for consideration of both atrial fib and flutter RFA. HTN -his bp is controlled. No change in his meds. He will remain on toprol for bp and HR control.  3.   Coags - he will continue xarelto.     Carleene Overlie Meilani Edmundson,MD

## 2022-07-23 NOTE — Patient Instructions (Signed)
Medication Instructions:  Your physician recommends that you continue on your current medications as directed. Please refer to the Current Medication list given to you today.  *If you need a refill on your cardiac medications before your next appointment, please call your pharmacy*   Lab Work: NONE   If you have labs (blood work) drawn today and your tests are completely normal, you will receive your results only by: [place] (if you have MyChart) OR A paper copy in the mail If you have any lab test that is abnormal or we need to change your treatment, we will call you to review the results.   Testing/Procedures: NONE    Follow-Up: At [facility], you and your health needs are our priority.  As part of our continuing mission to provide you with exceptional heart care, we have created designated Provider Care Teams.  These Care Teams include your primary Cardiologist (physician) and Advanced Practice Providers (APPs -  Physician Assistants and Nurse Practitioners) who all work together to provide you with the care you need, when you need it.  We recommend signing up for the patient portal called "MyChart".  Sign up information is provided on this After Visit Summary.  MyChart is used to connect with patients for Virtual Visits (Telemedicine).  Patients are able to view lab/test results, encounter notes, upcoming appointments, etc.  Non-urgent messages can be sent to your provider as well.   To learn more about what you can do with MyChart, go to [email or web address].    Your next appointment:    Next available   Provider:   Dr. Myles Gip     Other Instructions Thank you for choosing [facility]!

## 2022-07-26 ENCOUNTER — Ambulatory Visit: Payer: Medicare Other | Admitting: Internal Medicine

## 2022-08-15 ENCOUNTER — Ambulatory Visit: Payer: Medicare Other | Admitting: Cardiovascular Disease

## 2022-10-02 DIAGNOSIS — E039 Hypothyroidism, unspecified: Secondary | ICD-10-CM | POA: Diagnosis not present

## 2022-12-20 DIAGNOSIS — M9902 Segmental and somatic dysfunction of thoracic region: Secondary | ICD-10-CM | POA: Diagnosis not present

## 2022-12-20 DIAGNOSIS — M9905 Segmental and somatic dysfunction of pelvic region: Secondary | ICD-10-CM | POA: Diagnosis not present

## 2022-12-20 DIAGNOSIS — M62830 Muscle spasm of back: Secondary | ICD-10-CM | POA: Diagnosis not present

## 2022-12-20 DIAGNOSIS — M9903 Segmental and somatic dysfunction of lumbar region: Secondary | ICD-10-CM | POA: Diagnosis not present

## 2022-12-23 DIAGNOSIS — M62830 Muscle spasm of back: Secondary | ICD-10-CM | POA: Diagnosis not present

## 2022-12-23 DIAGNOSIS — M9905 Segmental and somatic dysfunction of pelvic region: Secondary | ICD-10-CM | POA: Diagnosis not present

## 2022-12-23 DIAGNOSIS — M9902 Segmental and somatic dysfunction of thoracic region: Secondary | ICD-10-CM | POA: Diagnosis not present

## 2022-12-23 DIAGNOSIS — M9903 Segmental and somatic dysfunction of lumbar region: Secondary | ICD-10-CM | POA: Diagnosis not present

## 2022-12-27 DIAGNOSIS — M9905 Segmental and somatic dysfunction of pelvic region: Secondary | ICD-10-CM | POA: Diagnosis not present

## 2022-12-27 DIAGNOSIS — M9902 Segmental and somatic dysfunction of thoracic region: Secondary | ICD-10-CM | POA: Diagnosis not present

## 2022-12-27 DIAGNOSIS — M62830 Muscle spasm of back: Secondary | ICD-10-CM | POA: Diagnosis not present

## 2022-12-27 DIAGNOSIS — M9903 Segmental and somatic dysfunction of lumbar region: Secondary | ICD-10-CM | POA: Diagnosis not present

## 2023-07-24 DIAGNOSIS — C678 Malignant neoplasm of overlapping sites of bladder: Secondary | ICD-10-CM | POA: Diagnosis not present

## 2023-09-05 DIAGNOSIS — E039 Hypothyroidism, unspecified: Secondary | ICD-10-CM | POA: Diagnosis not present

## 2023-09-05 DIAGNOSIS — M1991 Primary osteoarthritis, unspecified site: Secondary | ICD-10-CM | POA: Diagnosis not present

## 2023-09-05 DIAGNOSIS — K219 Gastro-esophageal reflux disease without esophagitis: Secondary | ICD-10-CM | POA: Diagnosis not present

## 2023-09-05 DIAGNOSIS — I700 Atherosclerosis of aorta: Secondary | ICD-10-CM | POA: Diagnosis not present

## 2023-09-05 DIAGNOSIS — Z9229 Personal history of other drug therapy: Secondary | ICD-10-CM | POA: Diagnosis not present

## 2023-09-05 DIAGNOSIS — Z0001 Encounter for general adult medical examination with abnormal findings: Secondary | ICD-10-CM | POA: Diagnosis not present

## 2023-09-05 DIAGNOSIS — C678 Malignant neoplasm of overlapping sites of bladder: Secondary | ICD-10-CM | POA: Diagnosis not present

## 2024-05-14 ENCOUNTER — Telehealth: Payer: Self-pay | Admitting: Medical

## 2024-05-14 NOTE — Telephone Encounter (Signed)
 Patient c/o Palpitations: STAT if patient c/o lightheadedness, shortness of breath, or chest pain  How long have you had palpitations/irregular HR/ Afib? Are you having the symptoms now?  Found out he was in afib at doctor's appointment a few days ago. Not having any symptoms.  Are you currently experiencing lightheadedness, SOB or CP?  No   Do you have a history of afib (atrial fibrillation) or irregular heart rhythm?  Hx afib   Have you checked your BP or HR? (document readings if available):  Per patient BP/HR are fine   Are you experiencing any other symptoms?  No

## 2024-05-17 NOTE — Telephone Encounter (Signed)
 Left a message for patient to call office regarding testing results.

## 2024-05-17 NOTE — Telephone Encounter (Signed)
 Spoke to patient who is agreeable with provider. Pt to be seen by EP and Gen Cards. Pt had no questions or concerns at this time.   Will route to schedulers to schedule pt to see EP/Gen Cards
